# Patient Record
Sex: Male | Born: 1965 | Race: White | Hispanic: No | Smoking: Former smoker
Health system: Southern US, Community
[De-identification: ages and names within clinical notes are randomized; demographics above are authoritative.]

## PROBLEM LIST (undated history)

## (undated) DIAGNOSIS — F419 Anxiety disorder, unspecified: Secondary | ICD-10-CM

## (undated) DIAGNOSIS — F4310 Post-traumatic stress disorder, unspecified: Secondary | ICD-10-CM

## (undated) DIAGNOSIS — F32A Depression, unspecified: Secondary | ICD-10-CM

## (undated) DIAGNOSIS — G8929 Other chronic pain: Secondary | ICD-10-CM

## (undated) DIAGNOSIS — M549 Dorsalgia, unspecified: Secondary | ICD-10-CM

## (undated) DIAGNOSIS — C801 Malignant (primary) neoplasm, unspecified: Secondary | ICD-10-CM

## (undated) DIAGNOSIS — F329 Major depressive disorder, single episode, unspecified: Secondary | ICD-10-CM

## (undated) DIAGNOSIS — I10 Essential (primary) hypertension: Secondary | ICD-10-CM

## (undated) DIAGNOSIS — F1021 Alcohol dependence, in remission: Secondary | ICD-10-CM

## (undated) HISTORY — DX: Depression, unspecified: F32.A

## (undated) HISTORY — PX: VASECTOMY: SHX75

## (undated) HISTORY — DX: Alcohol dependence, in remission: F10.21

## (undated) HISTORY — DX: Dorsalgia, unspecified: M54.9

## (undated) HISTORY — DX: Post-traumatic stress disorder, unspecified: F43.10

## (undated) HISTORY — PX: OTHER SURGICAL HISTORY: SHX169

## (undated) HISTORY — DX: Anxiety disorder, unspecified: F41.9

## (undated) HISTORY — DX: Other chronic pain: G89.29

## (undated) HISTORY — DX: Essential (primary) hypertension: I10

---
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
## 2019-01-19 ENCOUNTER — Encounter: Payer: Self-pay | Admitting: Gastroenterology

## 2019-02-10 NOTE — Progress Notes (Addendum)
REVIEWED-NO ADDITIONAL RECOMMENDATIONS.  Referring Provider: Alfonse Flavors, MD Primary Care Physician:  Alfonse Flavors, MD Primary Gastroenterologist:  Dr. Oneida Alar  Chief Complaint  Patient presents with  . Colonoscopy    pt says he has loss alot of weight    HPI:   Jesus Graham is a 53 y.o. male presenting today at the request of Zhou-Talbert, Elwyn Lade, MD for consult colonoscopy. Office visit due to meds.   Today he states he has never had a colonoscopy. No abdominal pain. BMs daily. No constipation or diarrhea. No bright red blood per rectum. No black stools. No regular nausea or vomiting. Occasional nausea with anxiety. Admits to heartburn at times.  States this is not frequent.  Will use Rolaids as needed. Also with reflux symptoms at night if he is not elevated. If elevated, he has no trouble. Has changed his diet over the last year due to his cholesterol. With change in diet, his reflux symptoms have improved. Eliminated fried foods, doesn't eat as much meat. Doesn't want to be on any medications for reflux. No trouble swallowing.   Has lost about 22-23 lbs in the last few months. Has a lot of anxiety. Stressed. About to get a divorce after just getting married in February 2020. Has limited appetite with this. Not sleeping well.  He is seeing another provider about his anxiety and depression.  No fever, chills. Admits to some lightheadedness when bending over or standing up. No chest pain, heart palpitations. Occasional shortness of breath with exertion, thinks this is related to his knee pain. No cough. No nasal congestion or sore throat. Chronic left knee pain. Goody powders, one a day to 2-3 a day, or a few days without one.   Past Medical History:  Diagnosis Date  . Anxiety   . Back pain   . Chronic pain of left knee   . Depression   . History of alcoholism ([place])    quit in 1995  . HTN (hypertension)   . PTSD (post-traumatic stress disorder)     Past  Surgical History:  Procedure Laterality Date  . left knee surgery     x 2  . VASECTOMY      Current Outpatient Medications  Medication Sig Dispense Refill  . amLODipine (NORVASC) 10 MG tablet Take 10 mg by mouth daily.    Marland Kitchen oxyCODONE-acetaminophen (PERCOCET) 7.5-325 MG tablet Take 1 tablet by mouth 4 (four) times daily.    Marland Kitchen CLENPIQ 10-3.5-12 MG-GM -GM/160ML SOLN Take 1 kit by mouth once for 1 dose. 320 mL 0   No current facility-administered medications for this visit.     Allergies as of 02/11/2019 - Review Complete 02/11/2019  Allergen Reaction Noted  . Celexa [citalopram] Other (See Comments) 02/11/2019  . Codeine Nausea Only 02/11/2019  . Nsaids Nausea Only 02/11/2019    Family History  Problem Relation Age of Onset  . Colon cancer Neg Hx     Social History   Socioeconomic History  . Marital status: Divorced    Spouse name: Not on file  . Number of children: Not on file  . Years of education: Not on file  . Highest education level: Not on file  Occupational History  . Not on file  Social Needs  . Financial resource strain: Not on file  . Food insecurity    Worry: Not on file    Inability: Not on file  . Transportation needs    Medical: Not on file  Non-medical: Not on file  Tobacco Use  . Smoking status: Never Smoker  . Smokeless tobacco: Current User  . Tobacco comment: Dips occasionally  Substance and Sexual Activity  . Alcohol use: Not Currently  . Drug use: Never  . Sexual activity: Not on file  Lifestyle  . Physical activity    Days per week: Not on file    Minutes per session: Not on file  . Stress: Not on file  Relationships  . Social Herbalist on phone: Not on file    Gets together: Not on file    Attends religious service: Not on file    Active member of club or organization: Not on file    Attends meetings of clubs or organizations: Not on file    Relationship status: Not on file  . Intimate partner violence    Fear of  current or ex partner: Not on file    Emotionally abused: Not on file    Physically abused: Not on file    Forced sexual activity: Not on file  Other Topics Concern  . Not on file  Social History Narrative  . Not on file    Review of Systems: Gen: See HPI CV: See HPI  Resp: See HPI  GI: See HPI GU : Denies urinary burning, urinary frequency. Admits to urinary hesitancy MS: Chronic left knee pain and back pain with radiation down his left leg.   Derm: Denies rash Psych: Admits to anxiety and depression. No SI.  Heme: Denies bruising or bleeding.   Physical Exam: BP 139/80   Pulse 73   Temp (!) 96.9 F (36.1 C)   Ht 6' (1.829 m)   Wt 198 lb 9.6 oz (90.1 kg)   BMI 26.94 kg/m  General:   Alert and oriented. Pleasant and cooperative. Well-nourished and well-developed.  Head:  Normocephalic and atraumatic. Eyes:  Without icterus, sclera clear and conjunctiva pink.  Ears:  Normal auditory acuity. Lungs:  Clear to auscultation bilaterally. No wheezes, rales, or rhonchi. No distress.  Heart:  S1, S2 present without murmurs appreciated.  Abdomen:  +BS, soft, non-tender and non-distended. No HSM noted. No guarding or rebound. No masses appreciated.  Rectal:  Deferred  Msk:  Symmetrical without gross deformities. Normal posture. Extremities:  Without edema. Neurologic:  Alert and  oriented x4;  grossly normal neurologically. Skin:  Intact without significant lesions or rashes. Psych:  Normal mood and affect.

## 2019-02-11 ENCOUNTER — Encounter: Payer: Self-pay | Admitting: *Deleted

## 2019-02-11 ENCOUNTER — Other Ambulatory Visit: Payer: Self-pay

## 2019-02-11 ENCOUNTER — Ambulatory Visit: Payer: Medicaid Other | Admitting: Gastroenterology

## 2019-02-11 ENCOUNTER — Encounter: Payer: Self-pay | Admitting: Gastroenterology

## 2019-02-11 ENCOUNTER — Other Ambulatory Visit: Payer: Self-pay | Admitting: *Deleted

## 2019-02-11 VITALS — BP 139/80 | HR 73 | Temp 96.9°F | Ht 72.0 in | Wt 198.6 lb

## 2019-02-11 DIAGNOSIS — Z1211 Encounter for screening for malignant neoplasm of colon: Secondary | ICD-10-CM

## 2019-02-11 DIAGNOSIS — K219 Gastro-esophageal reflux disease without esophagitis: Secondary | ICD-10-CM | POA: Insufficient documentation

## 2019-02-11 MED ORDER — CLENPIQ 10-3.5-12 MG-GM -GM/160ML PO SOLN: 1.0000 | Freq: Once | ORAL | 0 refills | Status: AC

## 2019-02-11 NOTE — Progress Notes (Signed)
cc'ed to pcp °

## 2019-02-11 NOTE — Patient Instructions (Signed)
We will get you scheduled for your colonoscopy with Dr. Oneida Alar in the near future.   We will follow-up with you as recommended at the time of your colonoscopy.   Aliene Altes, PA-C [facility]

## 2019-02-11 NOTE — Assessment & Plan Note (Addendum)
53 year old male with past medical history of PTSD, anxiety, depression, HTN, chronic left knee pain on Percocet 4 times daily who presents to schedule his for ever screening colonoscopy.  No significant lower GI symptoms.  He has lost about 22-23 pounds in the last few months.  Reports he is under a lot of anxiety and is stressed as he is about to get a divorce after just getting married in February 2020.  Has decreased appetite and is not sleeping well due to this.  No other alarm symptoms.  Proceed with TCS with propofol with Dr. Oneida Alar in the near future. The risks, benefits, and alternatives have been discussed in detail with patient. They have stated understanding and desire to proceed.  Follow-up as recommended at the time of procedure.

## 2019-02-11 NOTE — Assessment & Plan Note (Signed)
Patient reports history of acid reflux and heartburn symptoms.  He has been working on changing his diet over the last year due to elevated cholesterol and has had improvement in GERD symptoms as well.  Also sleeps with the head of his bed elevated to prevent nocturnal reflux.  Continues with intermittent reflux symptoms at times, depending on what he eats.  Uses Rolaids as needed.  He is not interested in taking any medications regularly for this.  He does take Goody powders for chronic knee pain.  Discussed my concern of developing gastritis or PUD and the importance of eliminating Goody powders and NSAIDs or taking a PPI to protect the stomach.  Patient states he will decrease Goody powder use and does not want to start a PPI.

## 2019-02-12 ENCOUNTER — Encounter: Payer: Self-pay | Admitting: *Deleted

## 2019-05-25 ENCOUNTER — Other Ambulatory Visit (HOSPITAL_COMMUNITY)
Admission: RE | Admit: 2019-05-25 | Discharge: 2019-05-25 | Disposition: A | Discharge status: Home / Self Care | Payer: Medicaid Other | Source: Ambulatory Visit | Attending: Gastroenterology | Admitting: Gastroenterology

## 2019-05-25 ENCOUNTER — Other Ambulatory Visit: Payer: Self-pay

## 2019-05-25 ENCOUNTER — Encounter (HOSPITAL_COMMUNITY)
Admission: RE | Admit: 2019-05-25 | Discharge: 2019-05-25 | Disposition: A | Discharge status: Home / Self Care | Payer: Medicaid Other | Source: Ambulatory Visit | Attending: Gastroenterology | Admitting: Gastroenterology

## 2019-05-25 DIAGNOSIS — Z20822 Contact with and (suspected) exposure to covid-19: Secondary | ICD-10-CM | POA: Insufficient documentation

## 2019-05-25 DIAGNOSIS — Z01812 Encounter for preprocedural laboratory examination: Secondary | ICD-10-CM | POA: Diagnosis not present

## 2019-05-25 LAB — SARS CORONAVIRUS 2 (TAT 6-24 HRS): SARS Coronavirus 2: NEGATIVE

## 2019-05-26 ENCOUNTER — Ambulatory Visit (HOSPITAL_COMMUNITY): Payer: Medicaid Other | Admitting: Anesthesiology

## 2019-05-26 ENCOUNTER — Other Ambulatory Visit: Payer: Self-pay

## 2019-05-26 ENCOUNTER — Telehealth: Payer: Self-pay

## 2019-05-26 ENCOUNTER — Encounter (HOSPITAL_COMMUNITY): Payer: Self-pay | Admitting: Gastroenterology

## 2019-05-26 ENCOUNTER — Ambulatory Visit (HOSPITAL_COMMUNITY)
Admission: RE | Admit: 2019-05-26 | Discharge: 2019-05-26 | Disposition: A | Discharge status: Home / Self Care | Payer: Medicaid Other | Attending: Gastroenterology | Admitting: Gastroenterology

## 2019-05-26 ENCOUNTER — Encounter (HOSPITAL_COMMUNITY)
Admission: RE | Disposition: A | Discharge status: Home / Self Care | Payer: Self-pay | Source: Home / Self Care | Attending: Gastroenterology

## 2019-05-26 DIAGNOSIS — Q438 Other specified congenital malformations of intestine: Secondary | ICD-10-CM | POA: Diagnosis not present

## 2019-05-26 DIAGNOSIS — Z79899 Other long term (current) drug therapy: Secondary | ICD-10-CM | POA: Diagnosis not present

## 2019-05-26 DIAGNOSIS — Z791 Long term (current) use of non-steroidal anti-inflammatories (NSAID): Secondary | ICD-10-CM | POA: Insufficient documentation

## 2019-05-26 DIAGNOSIS — M549 Dorsalgia, unspecified: Secondary | ICD-10-CM | POA: Diagnosis not present

## 2019-05-26 DIAGNOSIS — Z1211 Encounter for screening for malignant neoplasm of colon: Secondary | ICD-10-CM

## 2019-05-26 DIAGNOSIS — Z885 Allergy status to narcotic agent status: Secondary | ICD-10-CM | POA: Insufficient documentation

## 2019-05-26 DIAGNOSIS — F4310 Post-traumatic stress disorder, unspecified: Secondary | ICD-10-CM | POA: Diagnosis not present

## 2019-05-26 DIAGNOSIS — K621 Rectal polyp: Secondary | ICD-10-CM

## 2019-05-26 DIAGNOSIS — M25562 Pain in left knee: Secondary | ICD-10-CM | POA: Insufficient documentation

## 2019-05-26 DIAGNOSIS — K648 Other hemorrhoids: Secondary | ICD-10-CM | POA: Insufficient documentation

## 2019-05-26 DIAGNOSIS — G8929 Other chronic pain: Secondary | ICD-10-CM | POA: Insufficient documentation

## 2019-05-26 DIAGNOSIS — D123 Benign neoplasm of transverse colon: Secondary | ICD-10-CM | POA: Diagnosis not present

## 2019-05-26 DIAGNOSIS — Z888 Allergy status to other drugs, medicaments and biological substances status: Secondary | ICD-10-CM | POA: Insufficient documentation

## 2019-05-26 DIAGNOSIS — F419 Anxiety disorder, unspecified: Secondary | ICD-10-CM | POA: Insufficient documentation

## 2019-05-26 DIAGNOSIS — I10 Essential (primary) hypertension: Secondary | ICD-10-CM | POA: Insufficient documentation

## 2019-05-26 DIAGNOSIS — K219 Gastro-esophageal reflux disease without esophagitis: Secondary | ICD-10-CM | POA: Insufficient documentation

## 2019-05-26 DIAGNOSIS — K644 Residual hemorrhoidal skin tags: Secondary | ICD-10-CM | POA: Diagnosis not present

## 2019-05-26 DIAGNOSIS — F329 Major depressive disorder, single episode, unspecified: Secondary | ICD-10-CM | POA: Insufficient documentation

## 2019-05-26 DIAGNOSIS — K635 Polyp of colon: Secondary | ICD-10-CM | POA: Diagnosis not present

## 2019-05-26 DIAGNOSIS — F17290 Nicotine dependence, other tobacco product, uncomplicated: Secondary | ICD-10-CM | POA: Diagnosis not present

## 2019-05-26 HISTORY — PX: POLYPECTOMY: SHX5525

## 2019-05-26 HISTORY — PX: COLONOSCOPY WITH PROPOFOL: SHX5780

## 2019-05-26 SURGERY — COLONOSCOPY WITH PROPOFOL
Anesthesia: General

## 2019-05-26 MED ORDER — STERILE WATER FOR IRRIGATION IR SOLN
Status: DC | PRN
  Administered 2019-05-26: 100 mL

## 2019-05-26 MED ORDER — PROPOFOL 10 MG/ML IV BOLUS
INTRAVENOUS | Status: AC
  Filled 2019-05-26: qty 20

## 2019-05-26 MED ORDER — FENTANYL CITRATE (PF) 100 MCG/2ML IJ SOLN
INTRAMUSCULAR | Status: DC | PRN
  Administered 2019-05-26 (×2): 50 ug via INTRAVENOUS

## 2019-05-26 MED ORDER — FENTANYL CITRATE (PF) 100 MCG/2ML IJ SOLN
INTRAMUSCULAR | Status: AC
  Filled 2019-05-26: qty 2

## 2019-05-26 MED ORDER — LIDOCAINE HCL (CARDIAC) PF 100 MG/5ML IV SOSY
PREFILLED_SYRINGE | INTRAVENOUS | Status: DC | PRN
  Administered 2019-05-26: 50 mg via INTRATRACHEAL

## 2019-05-26 MED ORDER — LIDOCAINE 2% (20 MG/ML) 5 ML SYRINGE
INTRAMUSCULAR | Status: AC
  Filled 2019-05-26: qty 5

## 2019-05-26 MED ORDER — LACTATED RINGERS IV SOLN: INTRAVENOUS | Status: DC | PRN

## 2019-05-26 MED ORDER — PROPOFOL 10 MG/ML IV BOLUS
INTRAVENOUS | Status: DC | PRN
  Administered 2019-05-26: 100 mg via INTRAVENOUS

## 2019-05-26 MED ORDER — PROPOFOL 10 MG/ML IV BOLUS
INTRAVENOUS | Status: AC
  Filled 2019-05-26: qty 40

## 2019-05-26 MED ORDER — PROPOFOL 500 MG/50ML IV EMUL
INTRAVENOUS | Status: DC | PRN
  Administered 2019-05-26: 300 ug/kg/min via INTRAVENOUS

## 2019-05-26 NOTE — Transfer of Care (Signed)
Immediate Anesthesia Transfer of Care Note  Patient: Jesus Graham  Procedure(s) Performed: COLONOSCOPY WITH PROPOFOL (N/A ) POLYPECTOMY  Patient Location: PACU  Anesthesia Type:General  Level of Consciousness: awake, alert  and oriented  Airway & Oxygen Therapy: Patient Spontanous Breathing  Post-op Assessment: Report given to RN, Post -op Vital signs reviewed and stable and Patient moving all extremities X 4  Post vital signs: Reviewed and stable  Last Vitals:  Vitals Value Taken Time  BP 115/81 05/26/19 1109  Temp    Pulse 78 05/26/19 1111  Resp 19 05/26/19 1111  SpO2 99 % 05/26/19 1111  Vitals shown include unvalidated device data.  Last Pain:  Vitals:   05/26/19 1104  TempSrc:   PainSc: 0-No pain      Patients Stated Pain Goal: 6 ([id number])  Complications: No apparent anesthesia complications

## 2019-05-26 NOTE — Telephone Encounter (Signed)
Pt had called office after hours and left 2 voicemails,  difficult to understand pt d/t phone cutting in and out.   Called pt, he didn't receive call from pre-op nurse yesterday and he didn't know what time to arrive for TCS this morning. Pt said the hospital had already called him this morning and he is on way to hospital. Called and informed Threasa Beards pt is on the way.

## 2019-05-26 NOTE — Anesthesia Preprocedure Evaluation (Signed)
Anesthesia Evaluation  Patient identified by MRN, date of birth, ID band Patient awake    Reviewed: Allergy & Precautions, NPO status , Patient's Chart, lab work & pertinent test results  History of Anesthesia Complications Negative for: history of anesthetic complications  Airway Mallampati: II  TM Distance: >3 FB Neck ROM: Full    Dental  (+) Missing, Dental Advisory Given   Pulmonary Patient abstained from smoking.,    Pulmonary exam normal breath sounds clear to auscultation       Cardiovascular Exercise Tolerance: Good hypertension, Pt. on medications  Rhythm:Regular Rate:Normal     Neuro/Psych PSYCHIATRIC DISORDERS Anxiety Depression    GI/Hepatic Neg liver ROS, GERD  Controlled,  Endo/Other  negative endocrine ROS  Renal/GU negative Renal ROS  negative genitourinary   Musculoskeletal negative musculoskeletal ROS (+)   Abdominal   Peds negative pediatric ROS (+)  Hematology negative hematology ROS (+)   Anesthesia Other Findings   Reproductive/Obstetrics negative OB ROS                            Anesthesia Physical Anesthesia Plan  ASA: II  Anesthesia Plan: General   Post-op Pain Management:    Induction: Intravenous  PONV Risk Score and Plan: 1 and Treatment may vary due to age or medical condition and TIVA  Airway Management Planned: Nasal Cannula, Natural Airway and Simple Face Mask  Additional Equipment:   Intra-op Plan:   Post-operative Plan:   Informed Consent: I have reviewed the patients History and Physical, chart, labs and discussed the procedure including the risks, benefits and alternatives for the proposed anesthesia with the patient or authorized representative who has indicated his/her understanding and acceptance.     Dental advisory given  Plan Discussed with: CRNA  Anesthesia Plan Comments:         Anesthesia Quick Evaluation

## 2019-05-26 NOTE — Discharge Instructions (Signed)
You had 2 polyps removed. You have SMALL internal hemorrhoids.   DRINK WATER TO KEEP YOUR URINE LIGHT YELLOW.  FOLLOW A HIGH FIBER DIET. AVOID ITEMS THAT CAUSE BLOATING & GAS. SEE INFO BELOW.   USE PREPARATION H FOUR TIMES  A DAY IF NEEDED TO RELIEVE RECTAL PAIN/PRESSURE/BLEEDING.   YOUR BIOPSY RESULTS WILL BE BACK IN 5 BUSINESS DAYS.  YOUR next colonoscopy WILL BE SCHEDULED BASED ON YOUR FINAL PATHOLOGY REPORT.    Colonoscopy Care After Read the instructions outlined below and refer to this sheet in the next week. These discharge instructions provide you with general information on caring for yourself after you leave the hospital. While your treatment has been planned according to the most current medical practices available, unavoidable complications occasionally occur. If you have any problems or questions after discharge, call DR. Sharlyne Koeneman, [phone].  ACTIVITY  You may resume your regular activity, but move at a slower pace for the next 24 hours.   Take frequent rest periods for the next 24 hours.   Walking will help get rid of the air and reduce the bloated feeling in your belly (abdomen).   No driving for 24 hours (because of the medicine (anesthesia) used during the test).   You may shower.   Do not sign any important legal documents or operate any machinery for 24 hours (because of the anesthesia used during the test).    NUTRITION  Drink plenty of fluids.   You may resume your normal diet as instructed by your doctor.   Begin with a light meal and progress to your normal diet. Heavy or fried foods are harder to digest and may make you feel sick to your stomach (nauseated).   Avoid alcoholic beverages for 24 hours or as instructed.    MEDICATIONS  You may resume your normal medications.   WHAT YOU CAN EXPECT TODAY  Some feelings of bloating in the abdomen.   Passage of more gas than usual.   Spotting of blood in your stool or on the toilet paper  .   IF YOU HAD POLYPS REMOVED DURING THE COLONOSCOPY:  Eat a soft diet IF YOU HAVE NAUSEA, BLOATING, ABDOMINAL PAIN, OR VOMITING.    FINDING OUT THE RESULTS OF YOUR TEST Not all test results are available during your visit. DR. Oneida Alar WILL CALL YOU WITHIN 14 DAYS OF YOUR PROCEDUE WITH YOUR RESULTS. Do not assume everything is normal if you have not heard from DR. Drayk Humbarger, CALL HER OFFICE AT [phone].  SEEK IMMEDIATE MEDICAL ATTENTION AND CALL THE OFFICE: [phone] IF:  You have more than a spotting of blood in your stool.   Your belly is swollen (abdominal distention).   You are nauseated or vomiting.   You have a temperature over 101F.   You have abdominal pain or discomfort that is severe or gets worse throughout the day.   High-Fiber Diet A high-fiber diet changes your normal diet to include more whole grains, legumes, fruits, and vegetables. Changes in the diet involve replacing refined carbohydrates with unrefined foods. The calorie level of the diet is essentially unchanged. The Dietary Reference Intake (recommended amount) for adult males is 38 grams per day. For adult females, it is 25 grams per day. Pregnant and lactating women should consume 28 grams of fiber per day. Fiber is the intact part of a plant that is not broken down during digestion. Functional fiber is fiber that has been isolated from the plant to provide a beneficial effect in the  body.  PURPOSE  Increase stool bulk.   Ease and regulate bowel movements.   Lower cholesterol.   REDUCE RISK OF COLON CANCER  INDICATIONS THAT YOU NEED MORE FIBER  Constipation and hemorrhoids.   Uncomplicated diverticulosis (intestine condition) and irritable bowel syndrome.   Weight management.   As a protective measure against hardening of the arteries (atherosclerosis), diabetes, and cancer.   GUIDELINES FOR INCREASING FIBER IN THE DIET  Start adding fiber to the diet slowly. A gradual increase of about 5 more  grams (2 servings of most fruits or vegetables) per day is best. Too rapid an increase in fiber may result in constipation, flatulence, and bloating.   Drink enough water and fluids to keep your urine clear or pale yellow. Water, juice, or caffeine-free drinks are recommended. Not drinking enough fluid may cause constipation.   Eat a variety of high-fiber foods rather than one type of fiber.   Try to increase your intake of fiber through using high-fiber foods rather than fiber pills or supplements that contain small amounts of fiber.   The goal is to change the types of food eaten. Do not supplement your present diet with high-fiber foods, but replace foods in your present diet.    Polyps, Colon  A polyp is extra tissue that grows inside your body. Colon polyps grow in the large intestine. The large intestine, also called the colon, is part of your digestive system. It is a long, hollow tube at the end of your digestive tract where your body makes and stores stool. Most polyps are not dangerous. They are benign. This means they are not cancerous. But over time, some types of polyps can turn into cancer. Polyps that are smaller than a pea are usually not harmful. But larger polyps could someday become or may already be cancerous. To be safe, doctors remove all polyps and test them.   WHO GETS POLYPS? Anyone can get polyps, but certain people are more likely than others. You may have a greater chance of getting polyps if:  You are over 50.   You have had polyps before.   Someone in your family has had polyps.   Someone in your family has had cancer of the large intestine.   Find out if someone in your family has had polyps. You may also be more likely to get polyps if you:   Eat a lot of fatty foods   Smoke   Drink alcohol   Do not exercise  Eat too much   PREVENTION There is not one sure way to prevent polyps. You might be able to lower your risk of getting them if you:  Eat more  fruits and vegetables and less fatty food.   Do not smoke.   Avoid alcohol.   Exercise every day.   Lose weight if you are overweight.   Eating more calcium and folate can also lower your risk of getting polyps. Some foods that are rich in calcium are milk, cheese, and broccoli. Some foods that are rich in folate are chickpeas, kidney beans, and spinach.

## 2019-05-26 NOTE — H&P (Addendum)
Primary Care Physician:  Zhou-Talbert, Elwyn Lade, MD Primary Gastroenterologist:  Dr. Oneida Alar  Pre-Procedure History & Physical: HPI:  Jesus Graham is a 54 y.o. male here for SCREENING FOR COLON CANCER AND BARRETT'S ESOPHAGUS.  Past Medical History:  Diagnosis Date  . Anxiety   . Back pain   . Chronic pain of left knee   . Depression   . History of alcoholism ([place])    quit in 1995  . HTN (hypertension)   . PTSD (post-traumatic stress disorder)     Past Surgical History:  Procedure Laterality Date  . left knee surgery     x 2  . VASECTOMY      Prior to Admission medications   Medication Sig Start Date End Date Taking? Authorizing Provider  amLODipine (NORVASC) 10 MG tablet Take 10 mg by mouth daily.   Yes [provider]  diazepam (VALIUM) 2 MG tablet Take 4 mg by mouth daily in the afternoon.   Yes [provider]  diazepam (VALIUM) 5 MG tablet Take 10 mg by mouth in the morning and at bedtime.   Yes [provider]  DULoxetine (CYMBALTA) 60 MG capsule Take 60 mg by mouth daily.   Yes [provider]  gabapentin (NEURONTIN) 300 MG capsule Take 900 mg by mouth 3 (three) times daily.   Yes [provider]  HYDROcodone-acetaminophen (NORCO) 10-325 MG tablet Take 1 tablet by mouth every 6 (six) hours as needed (pain.).   Yes [provider]  Tolnaftate (ABSORBINE JR EX) Apply 1 application topically 4 (four) times daily as needed (pain.).   Yes [provider]    Allergies as of 02/11/2019 - Review Complete 02/11/2019  Allergen Reaction Noted  . Celexa [citalopram] Other (See Comments) 02/11/2019  . Codeine Nausea Only 02/11/2019  . Nsaids Nausea Only 02/11/2019    Family History  Problem Relation Age of Onset  . Colon cancer Neg Hx     Social History   Socioeconomic History  . Marital status: Legally Separated    Spouse name: Not on file  . Number of children: Not on file  . Years of education: Not on  file  . Highest education level: Not on file  Occupational History  . Not on file  Tobacco Use  . Smoking status: Never Smoker  . Smokeless tobacco: Current User  . Tobacco comment: Dips occasionally  Substance and Sexual Activity  . Alcohol use: Not Currently  . Drug use: Never  . Sexual activity: Not on file  Other Topics Concern  . Not on file  Social History Narrative  . Not on file   Social Determinants of Health   Financial Resource Strain:   . Difficulty of Paying Living Expenses:   Food Insecurity:   . Worried About Charity fundraiser in the Last Year:   . Arboriculturist in the Last Year:   Transportation Needs:   . Film/video editor (Medical):   Marland Kitchen Lack of Transportation (Non-Medical):   Physical Activity:   . Days of Exercise per Week:   . Minutes of Exercise per Session:   Stress:   . Feeling of Stress :   Social Connections:   . Frequency of Communication with Friends and Family:   . Frequency of Social Gatherings with Friends and Family:   . Attends Religious Services:   . Active Member of Clubs or Organizations:   . Attends Archivist Meetings:   Marland Kitchen Marital Status:  Intimate Partner Violence:   . Fear of Current or Ex-Partner:   . Emotionally Abused:   Marland Kitchen Physically Abused:   . Sexually Abused:     Review of Systems: See HPI, otherwise negative ROS   Physical Exam: BP (!) 146/97   Pulse 82   Temp 98.1 F (36.7 C) (Oral)   Resp 16   SpO2 97%  General:   Alert,  pleasant and cooperative in NAD Head:  Normocephalic and atraumatic. Neck:  Supple; Lungs:  Clear throughout to auscultation.    Heart:  Regular rate and rhythm. Abdomen:  Soft, nontender and nondistended. Normal bowel sounds, without guarding, and without rebound.   Neurologic:  Alert and  oriented x4;  grossly normal neurologically.  Impression/Plan:    SCREENING FOR COLON CANCER  Plan:  1. TCS TODAY. DISCUSSED PROCEDURE, BENEFITS, & RISKS: < 1% chance of  medication reaction, bleeding, perforation, ASPIRATION, or rupture of spleen/liver requiring surgery to fix it and missed polyps < 1 cm 10-20% of the time. 2. Pt declined EGD TODAY. HE WAS CONCERNED ABOUT COST.

## 2019-05-26 NOTE — Op Note (Signed)
[facility] Patient Name: Jesus Graham Procedure Date: 05/26/2019 10:06 AM MRN: [id number] Date of Birth: 03-Nov-1965 Attending MD: Barney Drain MD, MD CSN: [id number] Age: 54 Admit Type: Outpatient Procedure:                Colonoscopy WITH COLD SNARE POLYPECTOMY Indications:              Screening for colorectal malignant neoplasm Providers:                Barney Drain MD, MD, Rosina Lowenstein, RN, Randa Spike, Technician Referring MD:             Curtis Sites Medicines:                Propofol per Anesthesia Complications:            No immediate complications. Estimated Blood Loss:     Estimated blood loss was minimal. Procedure:                Pre-Anesthesia Assessment:                           - Prior to the procedure, a History and Physical                            was performed, and patient medications and                            allergies were reviewed. The patient's tolerance of                            previous anesthesia was also reviewed. The risks                            and benefits of the procedure and the sedation                            options and risks were discussed with the patient.                            All questions were answered, and informed consent                            was obtained. Prior Anticoagulants: The patient has                            taken no previous anticoagulant or antiplatelet                            agents. ASA Grade Assessment: II - A patient with                            mild systemic disease. After reviewing the risks  and benefits, the patient was deemed in                            satisfactory condition to undergo the procedure.                            After obtaining informed consent, the colonoscope                            was passed under direct vision. Throughout the                            procedure, the patient's blood pressure,  pulse, and                            oxygen saturations were monitored continuously. The                            CF-[id number] scope was introduced through                            the anus and advanced to the the cecum, identified                            by appendiceal orifice and ileocecal valve. The                            colonoscopy was somewhat difficult due to a                            tortuous colon. Successful completion of the                            procedure was aided by straightening and shortening                            the scope to obtain bowel loop reduction and                            COLOWRAP. The patient tolerated the procedure well.                            The quality of the bowel preparation was excellent.                            The ileocecal valve, appendiceal orifice, and                            rectum were photographed. Scope In: 10:45:42 AM Scope Out: 11:03:16 AM Scope Withdrawal Time: 0 hours 14 minutes 47 seconds  Total Procedure Duration: 0 hours 17 minutes 34 seconds  Findings:      Two sessile polyps were found in the rectum and hepatic flexure. The       polyps were  4 to 6 mm in size. These polyps were removed with a cold       snare. Resection and retrieval were complete.      External and internal hemorrhoids were found.      The recto-sigmoid colon and sigmoid colon were mildly tortuous. Impression:               - Two 4 to 6 mm polyps in the rectum and at the                            hepatic flexure, removed with a cold snare.                            Resected and retrieved.                           - External and internal hemorrhoids.                           - Tortuous colon. Moderate Sedation:      Per Anesthesia Care Recommendation:           - Patient has a contact number available for                            emergencies. The signs and symptoms of potential                            delayed  complications were discussed with the                            patient. Return to normal activities tomorrow.                            Written discharge instructions were provided to the                            patient.                           - High fiber diet.                           - Continue present medications.                           - Await pathology results.                           - Repeat colonoscopy date to be determined after                            pending pathology results are reviewed for                            surveillance. Procedure Code(s):        --- Professional ---  45385, Colonoscopy, flexible; with removal of                            tumor(s), polyp(s), or other lesion(s) by snare                            technique Diagnosis Code(s):        --- Professional ---                           Z12.11, Encounter for screening for malignant                            neoplasm of colon                           K62.1, Rectal polyp                           K63.5, Polyp of colon                           K64.8, Other hemorrhoids                           Q43.8, Other specified congenital malformations of                            intestine CPT copyright 2019 American Medical Association. All rights reserved. The codes documented in this report are preliminary and upon coder review may  be revised to meet current compliance requirements. Barney Drain, MD Barney Drain MD, MD 05/26/2019 11:13:36 AM This report has been signed electronically. Number of Addenda: 0

## 2019-05-26 NOTE — Anesthesia Postprocedure Evaluation (Signed)
Anesthesia Post Note  Patient: Jesus Graham  Procedure(s) Performed: COLONOSCOPY WITH PROPOFOL (N/A ) POLYPECTOMY  Patient location during evaluation: Endoscopy Anesthesia Type: General Level of consciousness: awake and alert Pain management: pain level controlled Vital Signs Assessment: post-procedure vital signs reviewed and stable Respiratory status: spontaneous breathing, nonlabored ventilation, respiratory function stable and patient connected to nasal cannula oxygen Cardiovascular status: blood pressure returned to baseline and stable Postop Assessment: no apparent nausea or vomiting Anesthetic complications: no     Last Vitals:  Vitals:   05/26/19 1110 05/26/19 1115  BP: 115/81 128/87  Pulse: 79 73  Resp: 19 (!) 9  Temp: 36.4 C   SpO2:  99%    Last Pain:  Vitals:   05/26/19 1115  TempSrc:   PainSc: 0-No pain                 Adair Patter Rahil Passey

## 2019-05-27 LAB — SURGICAL PATHOLOGY

## 2019-05-28 ENCOUNTER — Telehealth: Payer: Self-pay | Admitting: Gastroenterology

## 2019-05-28 NOTE — Telephone Encounter (Signed)
PLEASE CALL PT. HE HAD ONE SIMPLE ADENOMA AND ONE HYPERPLASTIC POLYP REMOVED. NEXT COLONOSCOPY AS SOON AS MAR 2026 AND NO LATER THAN MAR 2028.

## 2019-05-28 NOTE — Telephone Encounter (Signed)
Spoke with pt and he is aware of results. 

## 2019-05-28 NOTE — Telephone Encounter (Signed)
Reminder in epic °

## 2019-05-28 NOTE — Telephone Encounter (Signed)
Patient returned call, please call back  

## 2019-05-28 NOTE — Telephone Encounter (Signed)
Called pt and notified him HE HAD ONE SIMPLE ADENOMA AND ONE HYPERPLASTIC POLYP REMOVED. NEXT COLONOSCOPY AS SOON AS MAR 2026 AND NO LATER THAN MAR 2028. He thanked me for the call and stated he understood
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
## 2019-07-18 ENCOUNTER — Encounter (HOSPITAL_COMMUNITY): Payer: Self-pay | Admitting: Psychiatry

## 2019-07-18 ENCOUNTER — Other Ambulatory Visit: Payer: Self-pay

## 2019-07-18 ENCOUNTER — Telehealth (INDEPENDENT_AMBULATORY_CARE_PROVIDER_SITE_OTHER): Payer: Medicaid Other | Admitting: Psychiatry

## 2019-07-18 DIAGNOSIS — F331 Major depressive disorder, recurrent, moderate: Secondary | ICD-10-CM | POA: Diagnosis not present

## 2019-07-18 DIAGNOSIS — F4310 Post-traumatic stress disorder, unspecified: Secondary | ICD-10-CM | POA: Diagnosis not present

## 2019-07-18 MED ORDER — RISPERIDONE 0.5 MG PO TABS: 0.5000 mg | ORAL_TABLET | Freq: Every day | ORAL | 1 refills | Status: DC

## 2019-07-18 NOTE — Patient Instructions (Signed)
1. Continue duloxetine 60 mg daily  2. Start risperidone 0.5 mg at night  3. Next appointment: 6/18 at 10:50
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
## 2019-08-28 ENCOUNTER — Other Ambulatory Visit: Payer: Self-pay

## 2019-08-28 ENCOUNTER — Telehealth (HOSPITAL_COMMUNITY): Payer: Self-pay | Admitting: Psychiatry

## 2019-08-28 ENCOUNTER — Telehealth (HOSPITAL_COMMUNITY): Payer: Medicaid Other | Admitting: Psychiatry

## 2019-08-28 NOTE — Telephone Encounter (Signed)
Patient called to inquire if he would be seeing the same doctor, I advised pt he would be seeing Dr. Modesta Messing. Patient became upset and stated "I don't want to see her I want an [place]", advising he will not be seeing her, stating "I can't even understand her talk and I don't talk her talk  I'm an [place] and I want to see an [place]". Patient then advised he will have his doctor give him another referral for another place and then hung the phone up.

## 2019-09-05 ENCOUNTER — Emergency Department (HOSPITAL_COMMUNITY): Payer: Medicaid Other

## 2019-09-05 ENCOUNTER — Emergency Department (HOSPITAL_COMMUNITY)
Admission: EM | Admit: 2019-09-05 | Discharge: 2019-09-05 | Disposition: A | Discharge status: Home / Self Care | Payer: Medicaid Other | Attending: Emergency Medicine | Admitting: Emergency Medicine

## 2019-09-05 ENCOUNTER — Encounter (HOSPITAL_COMMUNITY): Payer: Self-pay | Admitting: Emergency Medicine

## 2019-09-05 ENCOUNTER — Other Ambulatory Visit: Payer: Self-pay

## 2019-09-05 DIAGNOSIS — I10 Essential (primary) hypertension: Secondary | ICD-10-CM | POA: Insufficient documentation

## 2019-09-05 DIAGNOSIS — M7989 Other specified soft tissue disorders: Secondary | ICD-10-CM | POA: Diagnosis not present

## 2019-09-05 DIAGNOSIS — Z87891 Personal history of nicotine dependence: Secondary | ICD-10-CM | POA: Diagnosis not present

## 2019-09-05 DIAGNOSIS — M79662 Pain in left lower leg: Secondary | ICD-10-CM | POA: Insufficient documentation

## 2019-09-05 DIAGNOSIS — Z79899 Other long term (current) drug therapy: Secondary | ICD-10-CM | POA: Diagnosis not present

## 2019-09-05 DIAGNOSIS — M79661 Pain in right lower leg: Secondary | ICD-10-CM | POA: Diagnosis not present

## 2019-09-05 LAB — BASIC METABOLIC PANEL
Anion gap: 8 (ref 5–15)
BUN: 12 mg/dL (ref 6–20)
CO2: 25 mmol/L (ref 22–32)
Calcium: 8.9 mg/dL (ref 8.9–10.3)
Chloride: 103 mmol/L (ref 98–111)
Creatinine, Ser: 0.9 mg/dL (ref 0.61–1.24)
GFR calc Af Amer: >60 mL/min (ref >60)
GFR calc non Af Amer: >60 mL/min (ref >60)
Glucose, Bld: 101 mg/dL — ABNORMAL HIGH (ref 70–99)
Potassium: 3.7 mmol/L (ref 3.5–5.1)
Sodium: 136 mmol/L (ref 135–145)

## 2019-09-05 LAB — CBC
HCT: 40.5 % (ref 39.0–52.0)
Hemoglobin: 13.8 g/dL (ref 13.0–17.0)
MCH: 30.3 pg (ref 26.0–34.0)
MCHC: 34.1 g/dL (ref 30.0–36.0)
MCV: 89 fL (ref 80.0–100.0)
Platelets: 287 10*3/uL (ref 150–400)
RBC: 4.55 MIL/uL (ref 4.22–5.81)
RDW: 13.3 % (ref 11.5–15.5)
WBC: 9.2 10*3/uL (ref 4.0–10.5)
nRBC: 0 % (ref 0.0–0.2)

## 2019-09-05 LAB — BRAIN NATRIURETIC PEPTIDE: B Natriuretic Peptide: 17 pg/mL (ref 0.0–100.0)

## 2019-09-05 MED ORDER — MORPHINE SULFATE (PF) 10 MG/ML IV SOLN
10.0000 mg | Freq: Once | INTRAVENOUS | Status: AC
  Administered 2019-09-05: 10 mg via INTRAMUSCULAR
  Filled 2019-09-05: qty 1

## 2019-09-05 NOTE — Discharge Instructions (Signed)
Your work-up today was overall reassuring.  Your ultrasound study did not show that you had a clot.  You also do not have a new onset heart failure.  Please use compression socks, elevate your legs, decrease salt intake to help decrease the swelling in your legs.  Provide a vascular doctor referral.  Please follow-up with your primary care doctor and pain doctor for further management of your sciatica symptoms

## 2019-09-05 NOTE — ED Notes (Signed)
Patient transported to Ultrasound 

## 2019-09-05 NOTE — ED Notes (Signed)
ED Provider at bedside. 

## 2019-09-05 NOTE — ED Notes (Signed)
Lab in room for bloodwork

## 2019-09-05 NOTE — ED Provider Notes (Signed)
[facility] Provider Note   CSN: [id number] Arrival date & time: 09/05/19  1107     History No chief complaint on file.   Jesus Graham is a 54 y.o. male.  HPI 54 year old male with a history of chronic back pain with sciatica, pain in left knee, depression, alcoholism, hypertension, PTSD and anxiety presents to the ER with a 1 week history of swelling to his lower extremities and pain starting in the left buttock radiating down into his leg.  Patient has been seen by a spine specialist and has received 2 lumbar ESI's with little long-term relief.  He states that he takes 900 mg of gabapentin 3 times a day and is also taking Percocet with little relief.  His main concern today is bilateral lower extremity swelling which started about a week ago.  He notes that he can stick his finger to his leg and it stays.  His legs have been mild to moderately painful, no noticeable erythema, has not noticed any numbness or tingling.  He has not noticed his extremities going cold.  This is never happened to him before.  He has no history of blood clots, no recent travel, not on blood thinners.  No history of heart failure.  He denies any groin numbness, foot drop, bowel or bladder incontinence.  Denies any shortness of breath, though he states that when he feels the shooting pain down his leg it catches his breath.  No fevers or chills, chest pain,abdominal pain, headache, dizziness.  Patient refers that he was put on a steroid pack many months ago by his PCP which helped his symptoms.   his pain is primarily managed by his PCP per the patient.    Past Medical History:  Diagnosis Date  . Anxiety   . Back pain   . Chronic pain of left knee   . Depression   . History of alcoholism ([place])    quit in 1995  . HTN (hypertension)   . PTSD (post-traumatic stress disorder)     Patient Active Problem List   Diagnosis Date Noted  . Colorectal polyps   . GERD (gastroesophageal reflux  disease) 02/11/2019  . Colon cancer screening 02/11/2019    Past Surgical History:  Procedure Laterality Date  . COLONOSCOPY WITH PROPOFOL N/A 05/26/2019   Procedure: COLONOSCOPY WITH PROPOFOL;  Surgeon: Danie Binder, MD;  [facility];  Service: Endoscopy;  Laterality: N/A;  8:45am  . left knee surgery     x 2  . POLYPECTOMY  05/26/2019   Procedure: POLYPECTOMY;  Surgeon: Danie Binder, MD;  [facility];  Service: Endoscopy;;  . VASECTOMY         Family History  Problem Relation Age of Onset  . Alcohol abuse Father   . Colon cancer Neg Hx     Social History   Tobacco Use  . Smoking status: Former Smoker    Types: Cigarettes    Quit date: 03/13/1999    Years since quitting: 20.4  . Smokeless tobacco: Current User  . Tobacco comment: Dips occasionally  Vaping Use  . Vaping Use: Never used  Substance Use Topics  . Alcohol use: Not Currently  . Drug use: Never    Home Medications Prior to Admission medications   Medication Sig Start Date End Date Taking? Authorizing Provider  amLODipine (NORVASC) 10 MG tablet Take 10 mg by mouth daily.    [provider]  diazepam (VALIUM) 2 MG tablet  Take 4 mg by mouth daily in the afternoon.    [provider]  diazepam (VALIUM) 5 MG tablet Take 10 mg by mouth in the morning and at bedtime.    [provider]  DULoxetine (CYMBALTA) 60 MG capsule Take 60 mg by mouth daily.    [provider]  gabapentin (NEURONTIN) 300 MG capsule Take 300 mg by mouth 3 (three) times daily.     [provider]  HYDROcodone-acetaminophen (NORCO) 10-325 MG tablet Take 1 tablet by mouth every 6 (six) hours as needed (pain.).    [provider]  risperiDONE (RISPERDAL) 0.5 MG tablet Take 1 tablet (0.5 mg total) by mouth at bedtime. 07/18/19   Norman Clay, MD  Tolnaftate (ABSORBINE JR EX) Apply 1 application topically 4 (four) times daily as needed (pain.).    [provider]    Allergies    Celexa [citalopram], Codeine, and Nsaids  Review of Systems   Review of Systems  Constitutional: Negative for chills and fever.  HENT: Negative for ear pain and sore throat.   Eyes: Negative for pain and visual disturbance.  Respiratory: Negative for cough and shortness of breath.   Cardiovascular: Positive for leg swelling. Negative for chest pain and palpitations.  Gastrointestinal: Negative for abdominal pain and vomiting.  Genitourinary: Negative for dysuria and hematuria.  Musculoskeletal: Positive for back pain. Negative for arthralgias.  Skin: Negative for color change and rash.  Neurological: Negative for seizures, syncope and headaches.  Psychiatric/Behavioral: Negative for confusion.  All other systems reviewed and are negative.   Physical Exam Updated Vital Signs BP (!) 149/91 (BP Location: Right Arm)   Pulse 98   Temp 98.1 F (36.7 C)   Resp 20   Ht 6' (1.829 m)   Wt 91.6 kg   SpO2 97%   BMI 27.40 kg/m   Physical Exam Vitals and nursing note reviewed.  Constitutional:      General: He is not in acute distress.    Appearance: Normal appearance. He is well-developed. He is not ill-appearing, toxic-appearing or diaphoretic.  HENT:     Head: Normocephalic and atraumatic.     Nose: Nose normal.     Mouth/Throat:     Mouth: Mucous membranes are moist.  Eyes:     Conjunctiva/sclera: Conjunctivae normal.  Cardiovascular:     Rate and Rhythm: Normal rate and regular rhythm.     Heart sounds: Normal heart sounds. No murmur heard.   Pulmonary:     Effort: Pulmonary effort is normal. No respiratory distress.     Breath sounds: Normal breath sounds.  Abdominal:     General: Abdomen is flat.     Palpations: Abdomen is soft.     Tenderness: There is no abdominal tenderness.  Musculoskeletal:        General: Swelling present. No tenderness.     Cervical back: Neck supple.     Right lower leg: Edema present.     Left lower leg: Edema  present.     Comments: 2+ pitting edema in extremities bilaterally.  2+ DP pulses located via Doppler.  He has some mild erythema to his lower extremities bilaterally.  Move seen all 4 extremities without difficulty.  Midline tenderness to C, T, L-spine.  Some tenderness to the left SI joint.<2 cap refill to LE bilaterally.  He does have significant varicose veins to both lower extremities.  Sensations intact.  Skin:    General: Skin is warm and dry.  Capillary Refill: Capillary refill takes less than 2 seconds.  Neurological:     General: No focal deficit present.     Mental Status: He is alert and oriented to person, place, and time.     Sensory: No sensory deficit.     Motor: No weakness.  Psychiatric:        Mood and Affect: Mood normal.        Behavior: Behavior normal.     ED Results / Procedures / Treatments   Labs (all labs ordered are listed, but only abnormal results are displayed) Labs Reviewed  BASIC METABOLIC PANEL  CBC  BRAIN NATRIURETIC PEPTIDE    EKG None  Radiology No results found.  Procedures Procedures (including critical care time)  Medications Ordered in ED Medications - No data to display  ED Course  I have reviewed the triage vital signs and the nursing notes.  Pertinent labs & imaging results that were available during my care of the patient were reviewed by me and considered in my medical decision making (see chart for details).    MDM Rules/Calculators/A&P                         Patient with sciatica flareup and swelling to the lower extremities x1 week. On presentation, the patient is alert and oriented, nontoxic-appearing, no acute distress, resting comfortably in ER bed.  He is slightly hypertensive here in the ED, however other vitals are reassuring.  He has bilateral lower extremity 2+ edema with mild erythema and significant varicose veins.  2+ DP pulses located with Doppler.  No history of blood clots, not on blood thinners.  He has  been moving all 4 extremities without difficulty.  No history of CHF.  We will start with basic labs, BNP and DVT study.  I suspect his swelling is secondary to potential varicose veins, however will rule out DVT, new onset CHF.  No swelling, warmth, pain to ankles or knees, doubt gout or septic joint.  Doubt ischemic limbs as pulses are 2+ with<2 cap refill.  DVT study negative for clot.  BNP normal, CBC and BMP without significant abnormalities.  Pain treated with morphine, on reevaluation patient states that he notes mild to moderate improvement in his pain.  I suspect that his swelling could be due to vascular issues given his varicose veins.  He has no chest pain or shortness of breath, doubt PE.  I discussed the findings with the patient, he is overall reassured.  I encouraged him to get some compression stockings, elevate his legs, decrease salt intake.  I hesitate to give him another Medrol Dosepak or steroids given that he is actively getting epidural steroid injections.  He has pending follow-up with the pain doctor in July.  He is comfortable with discharge and following up with his PCP.  I also will provide a vascular referral.  Return precautions given.  All the patient's questions have been answered to his satisfaction, he voices understanding is agreeable to the plan.    Final Clinical Impression(s) / ED Diagnoses Final diagnoses:  Pain and swelling of lower extremity, unspecified laterality    Rx / DC Orders ED Discharge Orders    None       Lyndel Safe 09/05/19 1405    Isla Pence, MD 09/05/19 1500

## 2019-09-05 NOTE — ED Triage Notes (Signed)
Patient c/o left buttock pain that radiates into left leg. Patient states treated for sciatica x1 week ago with steroid injection. Patient states pain has returned and now has swelling in ankles bilaterally. Patient states taking percocet with no relief- last one taken approx 40 minutes ago.

## 2019-09-05 NOTE — ED Notes (Addendum)
Pt with pain left buttock down left leg.   Pt has seen doctor twice with steroid injection. Bilateral lower leg swelling for past 3 days.  Sciatica pain since October 2020.

## 2019-09-16 ENCOUNTER — Telehealth (HOSPITAL_COMMUNITY): Payer: Self-pay | Admitting: *Deleted

## 2019-09-16 NOTE — Telephone Encounter (Signed)
Patient called very upset that he received a letter from Dr. Modesta Messing stating he is discharged. Per pt she can not be the only one speaking for the entire office and company. Per pt he wants to know why he is being discharged. Informed patient that per his previus call he did not want to see provider anymore due to her not being an American and not being able to speak [place]. Pt then agreed with information and stated that his PCP was the one that referred him there and did not know that the provider that he would get would be a Dr. Parks Ranger don't speak English or understand English that well. Staff tried to offer him another place to go. Patient then preceded to talk negatively about the provider and then hung up.

## 2020-03-22 ENCOUNTER — Encounter (HOSPITAL_COMMUNITY): Payer: Self-pay | Admitting: Emergency Medicine

## 2020-03-22 ENCOUNTER — Emergency Department (HOSPITAL_COMMUNITY): Payer: Medicaid Other

## 2020-03-22 ENCOUNTER — Other Ambulatory Visit: Payer: Self-pay

## 2020-03-22 ENCOUNTER — Emergency Department (HOSPITAL_COMMUNITY)
Admission: EM | Admit: 2020-03-22 | Discharge: 2020-03-22 | Disposition: A | Discharge status: Home / Self Care | Payer: Medicaid Other | Attending: Emergency Medicine | Admitting: Emergency Medicine

## 2020-03-22 DIAGNOSIS — Z79899 Other long term (current) drug therapy: Secondary | ICD-10-CM | POA: Diagnosis not present

## 2020-03-22 DIAGNOSIS — M25462 Effusion, left knee: Secondary | ICD-10-CM | POA: Insufficient documentation

## 2020-03-22 DIAGNOSIS — I10 Essential (primary) hypertension: Secondary | ICD-10-CM | POA: Diagnosis not present

## 2020-03-22 DIAGNOSIS — M25562 Pain in left knee: Secondary | ICD-10-CM | POA: Diagnosis present

## 2020-03-22 DIAGNOSIS — Z87891 Personal history of nicotine dependence: Secondary | ICD-10-CM | POA: Insufficient documentation

## 2020-03-22 DIAGNOSIS — Z85828 Personal history of other malignant neoplasm of skin: Secondary | ICD-10-CM | POA: Insufficient documentation

## 2020-03-22 HISTORY — DX: Malignant (primary) neoplasm, unspecified: C80.1

## 2020-03-22 MED ORDER — OXYCODONE-ACETAMINOPHEN 5-325 MG PO TABS
2.0000 | ORAL_TABLET | Freq: Once | ORAL | Status: AC
  Administered 2020-03-22: 2 via ORAL
  Filled 2020-03-22: qty 2

## 2020-03-22 NOTE — Discharge Instructions (Signed)
You were evaluated today in the department for your knee pain.  There is some swelling in your knee, however your x-ray was reassuring that there are no broken bones or dislocations.  I recommend that you rest, ice the joint for 15 to 20 minutes 3 times a day.  Avoid any heavy lifting or heavy activity on the leg for the next few days.  You may use a knee brace or Ace bandage as needed.    Below is the contact information for Dr. Amedeo Kinsman, orthopedic surgeon here in [place].  May follow-up with him if you prefer to see an orthopedic specialist or your primary care doctor for this issue.  Return emergency department develop any worsening numbness, tingling in your legs, or if you develop any weakness in your lower extremities, or any other new severe symptoms.

## 2020-03-22 NOTE — ED Triage Notes (Signed)
Pt c/o left leg pain from the hip down with knee swelling.  Pt has seen pain mgmt and PCP without relief.

## 2020-03-22 NOTE — ED Provider Notes (Signed)
[facility] EMERGENCY DEPARTMENT Provider Note   CSN: [id number] Arrival date & time: 03/22/20  1142     History Chief Complaint  Patient presents with  . Leg Pain    Jesus Graham is a 55 y.o. male with history of chronic left leg back pain who presents with concern for swelling and pain in his left knee.  Patient states that he recently moved and has been moving all of his furniture in his belongings into his new home.  States that the pain began approximately 10 days ago around the time that he started moving mask and progressively worse, now with swelling to his knee.  Patient has history of significant injury with fracture of the tibial plateau and knee in 2004.  Patient has had multiple surgeries on that knee.  Patient has undergone multiple steroid injections and courses of oral steroids for lumbar radiculopathy and nerve pain in the left leg, without significant relief.  He denies any new numbness or tingling in his foot, denies weakness in his lower extremities.  Endorses sensation of burning shooting electric pain down his left leg when he fully extends his left knee.  States his pain completely resolves if he bends at the waist making his torso parallel to the ground.  He denies fevers or chills at home denies saddle anesthesia, urinary retention, difficulty starting his urine stream.  Patient states he has presented to both his pain management clinic and his primary care doctor with concern for this issue, without resolution.  Patient states he was directed to come to the emergency department today for an MRI and a steroid injection.  I personally reviewed this patient's medical records.  He has history of hypertension, anxiety, PTSD, depression malignant melanoma.  Patient under pain contract with his primary care doctor in [place], takes oxycodone at home 10 mg as needed.  HPI     Past Medical History:  Diagnosis Date  . Anxiety   . Back pain   . Cancer ([place])    skin  cancer  . Chronic pain of left knee   . Depression   . 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 HTN (hypertension)   . PTSD (post-traumatic stress disorder)     Patient Active Problem List   Diagnosis Date Noted  . Colorectal polyps   . GERD (gastroesophageal reflux disease) 02/11/2019  . Colon cancer screening 02/11/2019    Past Surgical History:  Procedure Laterality Date  . COLONOSCOPY WITH PROPOFOL N/A 05/26/2019   Procedure: COLONOSCOPY WITH PROPOFOL;  Surgeon: Danie Binder, MD;  [facility];  Service: Endoscopy;  Laterality: N/A;  8:45am  . left knee surgery     x 2  . POLYPECTOMY  05/26/2019   Procedure: POLYPECTOMY;  Surgeon: Danie Binder, MD;  Location: [facility];  Service: Endoscopy;;  . VASECTOMY         Family History  Problem Relation Age of Onset  . Alcohol abuse Father   . Colon cancer Neg Hx     Social History   Tobacco Use  . Smoking status: Former Smoker    Types: Cigarettes    Quit date: 03/13/1999    Years since quitting: 21.0  . Smokeless tobacco: Current User  . Tobacco comment: Dips occasionally  Vaping Use  . Vaping Use: Never used  Substance Use Topics  . Alcohol use: Not Currently  . Drug use: Never    Home Medications Prior to Admission  medications   Medication Sig Start Date End Date Taking? Authorizing Provider  amLODipine (NORVASC) 10 MG tablet Take 10 mg by mouth daily.    [provider]  diazepam (VALIUM) 2 MG tablet Take 4 mg by mouth daily in the afternoon.    [provider]  diazepam (VALIUM) 5 MG tablet Take 10 mg by mouth in the morning and at bedtime.    [provider]  DULoxetine (CYMBALTA) 60 MG capsule Take 60 mg by mouth daily.    [provider]  gabapentin (NEURONTIN) 300 MG capsule Take 300 mg by mouth 3 (three) times daily.     [provider]  HYDROcodone-acetaminophen (NORCO) 10-325 MG tablet Take 1 tablet by mouth every 6 (six) hours as  needed (pain.).    [provider]  risperiDONE (RISPERDAL) 0.5 MG tablet Take 1 tablet (0.5 mg total) by mouth at bedtime. 07/18/19   Norman Clay, MD  Tolnaftate (ABSORBINE JR EX) Apply 1 application topically 4 (four) times daily as needed (pain.).    [provider]    Allergies    Celexa [citalopram], Codeine, and Nsaids  Review of Systems   Review of Systems  Constitutional: Negative.   HENT: Negative.   Respiratory: Negative.   Cardiovascular: Negative.   Gastrointestinal: Negative.   Genitourinary: Negative.   Musculoskeletal: Positive for arthralgias, back pain and joint swelling.  Skin: Negative.   Neurological: Negative.  Negative for syncope and numbness.    Physical Exam Updated Vital Signs BP (!) 150/99 (BP Location: Left Arm)   Pulse 87   Temp 98.1 F (36.7 C) (Oral)   Resp 18   Ht 6' (1.829 m)   Wt 92.5 kg   SpO2 97%   BMI 27.67 kg/m   Physical Exam Vitals and nursing note reviewed.  Constitutional:      Appearance: He is normal weight.  HENT:     Head: Normocephalic and atraumatic.     Nose: Nose normal.     Mouth/Throat:     Mouth: Mucous membranes are moist.     Pharynx: No oropharyngeal exudate or posterior oropharyngeal erythema.  Eyes:     General:        Right eye: No discharge.        Left eye: No discharge.     Conjunctiva/sclera: Conjunctivae normal.     Pupils: Pupils are equal, round, and reactive to light.  Cardiovascular:     Rate and Rhythm: Normal rate and regular rhythm.     Pulses: Normal pulses.     Heart sounds: Normal heart sounds. No murmur heard.   Pulmonary:     Effort: Pulmonary effort is normal. No respiratory distress.     Breath sounds: Normal breath sounds. No wheezing or rales.  Abdominal:     General: Bowel sounds are normal. There is no distension.     Palpations: Abdomen is soft.     Tenderness: There is no abdominal tenderness.  Musculoskeletal:        General: No deformity.      Cervical back: Normal and neck supple. No spasms, tenderness, bony tenderness or crepitus. Normal range of motion.     Thoracic back: Normal. No tenderness or bony tenderness.     Lumbar back: Spasms present. No tenderness or bony tenderness. Negative right straight leg raise test and negative left straight leg raise test.     Right hip: Normal.     Left hip: Normal.     Right  upper leg: Normal.     Left upper leg: Normal.     Right knee: Normal.     Left knee: Swelling and effusion present. No bony tenderness or crepitus. Tenderness present.     Right lower leg: Normal. No edema.     Left lower leg: Normal. No edema.     Right ankle: Normal.     Right Achilles Tendon: Normal.     Left ankle: Normal.     Left Achilles Tendon: Normal.     Right foot: Normal.     Left foot: Normal.       Legs:     Comments: 5/5 strength in dorsi-plantar flexion bilaterally. FROM of the knees and hips bilaterally, pain elicited with full extension of the left knee.    Skin:    General: Skin is warm and dry.     Capillary Refill: Capillary refill takes less than 2 seconds.  Neurological:     General: No focal deficit present.     Mental Status: He is alert and oriented to person, place, and time. Mental status is at baseline.     Sensory: Sensation is intact.     Motor: Motor function is intact.     Gait: Gait is intact.     Comments: Patient walks with cane at baseline.  Psychiatric:        Mood and Affect: Mood normal.     ED Results / Procedures / Treatments   Labs (all labs ordered are listed, but only abnormal results are displayed) Labs Reviewed - No data to display  EKG None  Radiology DG Knee Complete 4 Views Left  Result Date: 03/22/2020 CLINICAL DATA:  Left knee pain and swelling. EXAM: LEFT KNEE - COMPLETE 4+ VIEW COMPARISON:  No prior. FINDINGS: Prominent knee joint effusion. No acute bony abnormality. No evidence of fracture. Tricompartment compartment degenerative change.  Tiny loose body cannot be excluded. IMPRESSION: 1. Prominent knee joint effusion. 2. Tricompartment compartment degenerative change. Tiny loose body cannot be excluded. No acute bony abnormality. Electronically Signed   By: Marcello Moores  Register   On: 03/22/2020 13:45    Procedures Procedures (including critical care time)  Medications Ordered in ED Medications  oxyCODONE-acetaminophen (PERCOCET/ROXICET) 5-325 MG per tablet 2 tablet (2 tablets Oral Given 03/22/20 1407)    ED Course  I have reviewed the triage vital signs and the nursing notes.  Pertinent labs & imaging results that were available during my care of the patient were reviewed by me and considered in my medical decision making (see chart for details).    MDM Rules/Calculators/A&P                         55 year old male who presents with concern for 10 days of worsening left knee pain and swelling; history of chronic pain in this leg following injury in 2004.    Patient was hypertensive on intake 138/106, vital signs otherwise normal.  Physical exam is very reassuring.  Patient is neurovascularly intact in lower extremities bilaterally.  There does appear to be an effusion of the left knee, as well as associated tenderness to palpation, however there is full range of motion of the hips, knees, and ankles bilaterally.  Patient's gait is intact, though he walks with a cane at baseline due to chronic pain of left leg. There is No sign of erythema, induration, warmth to the touch of the left knee to infectious etiology. No concern for cauda  equina syndrome at this time.  We will proceed with plain film of the left knee at this time; analgesia offered. Plain film of the knee revealed prominent knee joint effusion, degenerative changes, no acute bony abnormality.  Given reassuring physical exam, vital signs, and x-ray of the left knee, no further work-up is warranted in the emergency department at this time. Etiology for this patient's  symptoms does not appear to be emergent.  Suspect this patient's symptoms are secondary to known arthritis and degenerative changes in the knee; effusion likely secondary to increased activity in the knee with recent moving and transferring belongings to new home.  Given localized symptoms to the knee and distally, do not feel this is a lumbar radiculopathy; do not feel steroid burst would be appropriate at this time. Discussed with patient that an MRI is not warranted emergently at this time, and that he should follow-up with his primary care doctor or orthopedic provider to schedule an outpatient MRI of the knee if that is what they prefer.  He was agreeable with this plan, and appreciated reassurance with today's x-ray.  Kaydan voiced understanding of his medical evaluation and treatment plan.  His questions were answered to his expressed satisfaction.  Return precautions were given.  Patient is stable and appropriate for discharge at this time.  This chart was dictated using voice recognition software, Dragon. Despite the best efforts of this provider to proofread and correct errors, errors may still occur which can change documentation meaning.  Final Clinical Impression(s) / ED Diagnoses Final diagnoses:  Effusion of left knee    Rx / DC Orders ED Discharge Orders    None       Aura Dials 03/22/20 1545    Milton Ferguson, MD 03/25/20 [phone]

## 2020-04-25 ENCOUNTER — Other Ambulatory Visit: Payer: Self-pay

## 2020-04-25 ENCOUNTER — Ambulatory Visit (HOSPITAL_COMMUNITY): Payer: Medicaid Other | Attending: Family Medicine | Admitting: Physical Therapy

## 2020-04-25 ENCOUNTER — Encounter (HOSPITAL_COMMUNITY): Payer: Self-pay | Admitting: Physical Therapy

## 2020-04-25 DIAGNOSIS — R29898 Other symptoms and signs involving the musculoskeletal system: Secondary | ICD-10-CM

## 2020-04-25 DIAGNOSIS — R2689 Other abnormalities of gait and mobility: Secondary | ICD-10-CM | POA: Insufficient documentation

## 2020-04-25 DIAGNOSIS — M25562 Pain in left knee: Secondary | ICD-10-CM | POA: Diagnosis present

## 2020-04-25 DIAGNOSIS — M545 Low back pain, unspecified: Secondary | ICD-10-CM

## 2020-04-25 DIAGNOSIS — M6281 Muscle weakness (generalized): Secondary | ICD-10-CM | POA: Diagnosis present

## 2020-04-25 NOTE — Patient Instructions (Signed)
Access Code: [email or web address] URL: [email or web address] Date: 04/25/2020 Prepared by: [facility] Pessy Delamar  Exercises [employer] Up - 3 x daily - 7 x weekly - 2 sets - 10 reps - 2 seconds hold

## 2020-04-25 NOTE — Therapy (Signed)
[place], [place], [address] Phone: [phone]   Fax:  [phone]  Physical Therapy Evaluation  Patient Details  Name: Jesus Graham MRN: [id number] Date of Birth: 1965/04/06 Referring Provider (PT): Curtis Sites MD   Encounter Date: 04/25/2020   PT End of Session - 04/25/20 0957    Visit Number 1    Number of Visits 8    Date for PT Re-Evaluation 05/23/20    Authorization Type [employer]    Authorization Time Period 8 visits requested 2/14-3/14 - check auth    Authorization - Visit Number 1    Authorization - Number of Visits 1    PT Start Time 0915    PT Stop Time 0953    PT Time Calculation (min) 38 min    Activity Tolerance Patient tolerated treatment well;Patient limited by pain    Behavior During Therapy [facility] for tasks assessed/performed           Past Medical History:  Diagnosis Date  . Anxiety   . Back pain   . Cancer ([place])    skin cancer  . Chronic pain of left knee   . Depression   . History of alcoholism ([employer])    quit in 1995  . HTN (hypertension)   . PTSD (post-traumatic stress disorder)     Past Surgical History:  Procedure Laterality Date  . COLONOSCOPY WITH PROPOFOL N/A 05/26/2019   Procedure: COLONOSCOPY WITH PROPOFOL;  Surgeon: Danie Binder, MD;  [facility];  Service: Endoscopy;  Laterality: N/A;  8:45am  . left knee surgery     x 2  . POLYPECTOMY  05/26/2019   Procedure: POLYPECTOMY;  Surgeon: Danie Binder, MD;  [facility];  Service: Endoscopy;;  . VASECTOMY      There were no vitals filed for this visit.    Subjective Assessment - 04/25/20 0916    Subjective Patient is a 55 y.o. male who presents to physical therapy with c/o low back pain, chronic pain, radiculopathy of L, and L knee pain. Patient states his leg is burning and numb all the way through his leg. His symptoms are constant. He has a pinched nerve in his back. He had shots  in his back which helped a little and then came right back. He is having trouble walking, household chores, getting ready. He has some days that things get better. Stretching upright shoots pain down his leg. He tried chiro, shots, meds which didn't help. Patient states his main goal is to get an MRI. He noticed pain after stretching to clean a TV about 17 months ago.    Pertinent History chronic back pain, prior L knee injury    Limitations House hold activities;Standing;Walking;Lifting    How long can you walk comfortably? unable    Patient Stated Goals get MRI    Currently in Pain? Yes    Pain Score 9     Pain Location Back    Pain Orientation Left;Lower    Pain Descriptors / Indicators Burning;Numbness    Pain Type Chronic pain    Pain Onset More than a month ago    Pain Frequency Constant              [facility] PT Assessment - 04/25/20 0001      Assessment   Medical Diagnosis DDD lumbar, chronic pain, radiculopathy, pain in L knee    Referring Provider (PT) Curtis Sites MD  Onset Date/Surgical Date 12/11/18    Next MD Visit Feb 24th    Prior Therapy yes      Precautions   Precautions None      Restrictions   Weight Bearing Restrictions No      Balance Screen   Has the patient fallen in the past 6 months Yes    How many times? 3    Has the patient had a decrease in activity level because of a fear of falling?  No    Is the patient reluctant to leave their home because of a fear of falling?  No      Prior Function   Level of Independence Independent      Cognition   Overall Cognitive Status Within Functional Limits for tasks assessed      Observation/Other Assessments   Observations Ambulates with SPC, limited knee flexion/extension ROM    Focus on Therapeutic Outcomes (FOTO)  n/a      Sensation   Light Touch Impaired Detail    Light Touch Impaired Details Impaired LLE    Additional Comments decreased L3-L5 on LLE      ROM / Strength   AROM / PROM /  Strength AROM;Strength      AROM   Overall AROM Comments pain and increase in symptoms with flexion/ext/ lateral bending worst with lumbar extension    AROM Assessment Site Lumbar    Lumbar Flexion 25% limited    Lumbar Extension 25% limited    Lumbar - Right Side Bend 25% limited    Lumbar - Left Side Bend 50% limited    Lumbar - Right Rotation 25% limited    Lumbar - Left Rotation 0% limited      Strength   Strength Assessment Site Hip;Knee;Ankle    Right/Left Hip Right;Left    Right Hip Flexion 4+/5    Left Hip Flexion 4-/5    Right/Left Knee Right;Left    Right Knee Flexion 5/5    Right Knee Extension 5/5    Left Knee Flexion 4/5    Left Knee Extension 4-/5    Right/Left Ankle Right;Left    Right Ankle Dorsiflexion 5/5    Left Ankle Dorsiflexion 4-/5      Palpation   Spinal mobility hypomobile thoracic and lumbar spine, painful L2-L4    Palpation comment TTP L lumbar paraspinals, L piriformis, not tender throughout rest of back or glutes      Special Tests   Other special tests positive femoral nerve tension on LLE, negative RLE      Transfers   Comments slow, labored with use of hands, relies on RLE      Ambulation/Gait   Ambulation/Gait Yes    Ambulation/Gait Assistance 6: Modified independent (Device/Increase time)    Ambulation Distance (Feet) 325 Feet    Assistive device Straight cane    Gait Pattern Left flexed knee in stance;Decreased hip/knee flexion - left    Ambulation Surface Level;Indoor    Gait velocity decreased    Gait Comments 2MWT, fatigued following                      Objective measurements completed on examination: See above findings.       [facility] Adult PT Treatment/Exercise - 04/25/20 0001      Exercises   Exercises Lumbar      Lumbar Exercises: Stretches   Press Ups 10 reps  PT Education - 04/25/20 0916    Education Details Patient educated on exam findings, POC, scope of PT, HEP     Person(s) Educated Patient    Methods Explanation;Demonstration;Handout    Comprehension Verbalized understanding;Returned demonstration            PT Short Term Goals - 04/25/20 1214      PT SHORT TERM GOAL #1   Title Patient will be independent with HEP in order to improve functional outcomes.    Time 2    Period Weeks    Status New    Target Date 05/09/20      PT SHORT TERM GOAL #2   Title Patient will report at least 25% improvement in symptoms for improved quality of life.    Time 2    Period Weeks    Status New    Target Date 05/09/20             PT Long Term Goals - 04/25/20 1215      PT LONG TERM GOAL #1   Title Patient will report at least 75% improvement in symptoms for improved quality of life.    Time 4    Period Weeks    Status New    Target Date 05/23/20      PT LONG TERM GOAL #2   Title Patient will be able to ambulate at least 425 feet in 2MWT in order to demonstrate improved gait speed for community ambulation.    Time 4    Period Weeks    Status New    Target Date 05/23/20      PT LONG TERM GOAL #3   Title Patient will demonstrate at least 25% improvement in lumbar ROM in all restricted planes for improved ability to move trunk while completing chores.    Time 4    Period Weeks    Status New    Target Date 05/23/20                  Plan - 04/25/20 1204    Clinical Impression Statement Patient is a 55 y.o. male who presents to physical therapy with c/o low back pain, chronic pain, radiculopathy of L, and L knee pain. He presents with pain limited deficits in lumbar and LLE strength, ROM, endurance, postural impairments, spinal mobility, altered sensation, and functional mobility with ADL. He is having to modify and restrict ADL as indicated by subjective information and objective measures which is affecting overall participation. Patient will benefit from skilled physical therapy in order to improve function and reduce impairment.     Personal Factors and Comorbidities Fitness;Past/Current Experience;Behavior Pattern;Education;Time since onset of injury/illness/exacerbation;Social Background;Profession;Comorbidity 3+    Comorbidities chronic pain/ chronic back pain, L knee injuries, depression, anxiety    Examination-Activity Limitations Locomotion Level;Transfers;Bed Mobility;Bend;Lift;Stairs;Squat    Examination-Participation Restrictions Cleaning;Meal Prep;Occupation;Community Activity;Volunteer;Shop;Yard Work;Laundry    Stability/Clinical Decision Making Evolving/Moderate complexity    Clinical Decision Making Moderate    Rehab Potential Fair    PT Frequency 2x / week    PT Duration 4 weeks    PT Treatment/Interventions ADLs/Self Care Home Management;Aquatic Therapy;Electrical Stimulation;Cryotherapy;Iontophoresis 4mg /ml Dexamethasone;Moist Heat;Traction;Ultrasound;DME Instruction;Gait training;Stair training;Functional mobility training;Therapeutic activities;Therapeutic exercise;Balance training;Neuromuscular re-education;Patient/family education;Orthotic Fit/Training;Manual techniques;Scar mobilization;Passive range of motion;Dry needling;Energy conservation;Splinting;Taping;Spinal Manipulations;Joint Manipulations    PT Next Visit Plan f/u press up and discontinue if worse, trial piriformis stretch, SKTC, begin core strengthening, further test glute strength, probably initiate hip strengthening, LLE strengthening    PT Home Exercise Plan 2/14  Press up    [employer] and Agree with Plan of Care Patient           Patient will benefit from skilled therapeutic intervention in order to improve the following deficits and impairments:  Abnormal gait,Difficulty walking,Decreased range of motion,Decreased endurance,Increased muscle spasms,Decreased activity tolerance,Pain,Impaired perceived functional ability,Decreased balance,Hypomobility,Improper body mechanics,Impaired flexibility,Decreased mobility,Decreased strength,Postural  dysfunction  Visit Diagnosis: Low back pain, unspecified back pain laterality, unspecified chronicity, unspecified whether sciatica present  Muscle weakness (generalized)  Other abnormalities of gait and mobility  Other symptoms and signs involving the musculoskeletal system  Left knee pain, unspecified chronicity     Problem List Patient Active Problem List   Diagnosis Date Noted  . Colorectal polyps   . GERD (gastroesophageal reflux disease) 02/11/2019  . Colon cancer screening 02/11/2019   12:17 PM, 04/25/20 Mearl Latin PT, DPT Physical Therapist at [place], [place], [address] Phone: [phone]   Fax:  [phone]  Name: Jesus Graham MRN: [id number] Date of Birth: Jul 28, 1965

## 2020-04-29 ENCOUNTER — Other Ambulatory Visit: Payer: Self-pay

## 2020-04-29 ENCOUNTER — Ambulatory Visit (HOSPITAL_COMMUNITY): Payer: Medicaid Other

## 2020-04-29 DIAGNOSIS — R2689 Other abnormalities of gait and mobility: Secondary | ICD-10-CM

## 2020-04-29 DIAGNOSIS — M545 Low back pain, unspecified: Secondary | ICD-10-CM | POA: Diagnosis not present

## 2020-04-29 DIAGNOSIS — M6281 Muscle weakness (generalized): Secondary | ICD-10-CM

## 2020-04-29 DIAGNOSIS — R29898 Other symptoms and signs involving the musculoskeletal system: Secondary | ICD-10-CM

## 2020-04-29 NOTE — Therapy (Signed)
[place], [place], [address] Phone: [phone]   Fax:  [phone]  Physical Therapy Treatment  Patient Details  Name: Jesus Graham MRN: [id number] Date of Birth: 1966/03/12 Referring Provider (PT): Curtis Sites MD   Encounter Date: 04/29/2020   PT End of Session - 04/29/20 1356    Visit Number 2    Number of Visits 8    Date for PT Re-Evaluation 05/23/20    Authorization Type [employer]    Authorization Time Period 8 visits requested 2/14-3/14 - check auth    Authorization - Visit Number 1    Authorization - Number of Visits 1    PT Start Time 8299    PT Stop Time 1433    PT Time Calculation (min) 41 min    Activity Tolerance Patient tolerated treatment well;Patient limited by pain    Behavior During Therapy [facility] for tasks assessed/performed           Past Medical History:  Diagnosis Date  . Anxiety   . Back pain   . Cancer ([place])    skin cancer  . Chronic pain of left knee   . Depression   . 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 HTN (hypertension)   . PTSD (post-traumatic stress disorder)     Past Surgical History:  Procedure Laterality Date  . COLONOSCOPY WITH PROPOFOL N/A 05/26/2019   Procedure: COLONOSCOPY WITH PROPOFOL;  Surgeon: Danie Binder, MD;  [facility];  Service: Endoscopy;  Laterality: N/A;  8:45am  . left knee surgery     x 2  . POLYPECTOMY  05/26/2019   Procedure: POLYPECTOMY;  Surgeon: Danie Binder, MD;  [facility];  Service: Endoscopy;;  . VASECTOMY      There were no vitals filed for this visit.   Subjective Assessment - 04/29/20 1354    Subjective Patient reports increased LLE pain and increased pain with weight bearing on LLE and outlines low back, buttock, and dorsum of foot    Pertinent History chronic back pain, prior L knee injury    Limitations House hold activities;Standing;Walking;Lifting    How long can you walk  comfortably? unable    Patient Stated Goals get MRI    Currently in Pain? Yes    Pain Score 9     Pain Location Leg    Pain Orientation Left;Lower    Pain Descriptors / Indicators Burning;Numbness    Pain Type Chronic pain    Pain Onset More than a month ago                             [facility] Adult PT Treatment/Exercise - 04/29/20 0001      Lumbar Exercises: Prone   Other Prone Lumbar Exercises prone x 5-10 min      Manual Therapy   Manual Therapy Joint mobilization;Manual Traction    Manual therapy comments Performed independently of all other interventions    Joint Mobilization lumbar P-A grade 3. Gapping mobilization for lumbar spine with pt in prone    Manual Traction initiated with pt in supine and knees/hips flexed on stability ball performing manual traction but without desired effect.  Placed in prone and performed LAD with pt experiencing decompression in prone position                  PT Education - 04/29/20  1430    Education Details pt education in posture and body mechanics and lumbar spine anatomy and benefits of lumbar support    Person(s) Educated Patient    Methods Explanation;Demonstration    Comprehension Verbalized understanding            PT Short Term Goals - 04/25/20 1214      PT SHORT TERM GOAL #1   Title Patient will be independent with HEP in order to improve functional outcomes.    Time 2    Period Weeks    Status New    Target Date 05/09/20      PT SHORT TERM GOAL #2   Title Patient will report at least 25% improvement in symptoms for improved quality of life.    Time 2    Period Weeks    Status New    Target Date 05/09/20             PT Long Term Goals - 04/25/20 1215      PT LONG TERM GOAL #1   Title Patient will report at least 75% improvement in symptoms for improved quality of life.    Time 4    Period Weeks    Status New    Target Date 05/23/20      PT LONG TERM GOAL #2   Title Patient will be  able to ambulate at least 425 feet in 2MWT in order to demonstrate improved gait speed for community ambulation.    Time 4    Period Weeks    Status New    Target Date 05/23/20      PT LONG TERM GOAL #3   Title Patient will demonstrate at least 25% improvement in lumbar ROM in all restricted planes for improved ability to move trunk while completing chores.    Time 4    Period Weeks    Status New    Target Date 05/23/20                 Plan - 04/29/20 1439    Clinical Impression Statement Pt reports increase in LLE pain and difficulty in walking.  Patient reports decrease in pain following tx session employing various manual techniques and demo good recall with prone positioning and rationale for lumbar biomechanics. Good respone to prone position and traction in prone with reports of some decompression experienced.  Continued tx indicated to assess if centralization of symptoms can occur    Personal Factors and Comorbidities Fitness;Past/Current Experience;Behavior Pattern;Education;Time since onset of injury/illness/exacerbation;Social Background;Profession;Comorbidity 3+    Comorbidities chronic pain/ chronic back pain, L knee injuries, depression, anxiety    Examination-Activity Limitations Locomotion Level;Transfers;Bed Mobility;Bend;Lift;Stairs;Squat    Examination-Participation Restrictions Cleaning;Meal Prep;Occupation;Community Activity;Volunteer;Shop;Yard Work;Laundry    Stability/Clinical Decision Making Evolving/Moderate complexity    Rehab Potential Fair    PT Frequency 2x / week    PT Duration 4 weeks    PT Treatment/Interventions ADLs/Self Care Home Management;Aquatic Therapy;Electrical Stimulation;Cryotherapy;Iontophoresis 4mg /ml Dexamethasone;Moist Heat;Traction;Ultrasound;DME Instruction;Gait training;Stair training;Functional mobility training;Therapeutic activities;Therapeutic exercise;Balance training;Neuromuscular re-education;Patient/family education;Orthotic  Fit/Training;Manual techniques;Scar mobilization;Passive range of motion;Dry needling;Energy conservation;Splinting;Taping;Spinal Manipulations;Joint Manipulations    PT Next Visit Plan f/u press up and discontinue if worse, trial piriformis stretch, SKTC, begin core strengthening, further test glute strength, probably initiate hip strengthening, LLE strengthening. Good response to prone position and prone traction, trial MECHANICAL TRACTION?    PT Home Exercise Plan 2/14 Press up. SItting with lumbar support    Consulted and Agree with Plan of Care Patient  Patient will benefit from skilled therapeutic intervention in order to improve the following deficits and impairments:  Abnormal gait,Difficulty walking,Decreased range of motion,Decreased endurance,Increased muscle spasms,Decreased activity tolerance,Pain,Impaired perceived functional ability,Decreased balance,Hypomobility,Improper body mechanics,Impaired flexibility,Decreased mobility,Decreased strength,Postural dysfunction  Visit Diagnosis: Low back pain, unspecified back pain laterality, unspecified chronicity, unspecified whether sciatica present  Muscle weakness (generalized)  Other abnormalities of gait and mobility  Other symptoms and signs involving the musculoskeletal system     Problem List Patient Active Problem List   Diagnosis Date Noted  . Colorectal polyps   . GERD (gastroesophageal reflux disease) 02/11/2019  . Colon cancer screening 02/11/2019    3:26 PM, 04/29/20 M. Sherlyn Lees, PT, DPT Physical Therapist- [place] Office Number: [phone]  [place] [address] [place], [place], [address] Phone: [phone]   Fax:  [phone]  Name: Jesus Graham MRN: [id number] Date of Birth: 27-Oct-1965

## 2020-05-03 ENCOUNTER — Ambulatory Visit (HOSPITAL_COMMUNITY): Payer: Medicaid Other | Admitting: Physical Therapy

## 2020-05-03 ENCOUNTER — Other Ambulatory Visit: Payer: Self-pay

## 2020-05-03 ENCOUNTER — Encounter (HOSPITAL_COMMUNITY): Payer: Self-pay | Admitting: Physical Therapy

## 2020-05-03 DIAGNOSIS — M545 Low back pain, unspecified: Secondary | ICD-10-CM

## 2020-05-03 DIAGNOSIS — R29898 Other symptoms and signs involving the musculoskeletal system: Secondary | ICD-10-CM

## 2020-05-03 DIAGNOSIS — R2689 Other abnormalities of gait and mobility: Secondary | ICD-10-CM

## 2020-05-03 DIAGNOSIS — M6281 Muscle weakness (generalized): Secondary | ICD-10-CM

## 2020-05-03 DIAGNOSIS — M25562 Pain in left knee: Secondary | ICD-10-CM

## 2020-05-03 NOTE — Patient Instructions (Signed)
Access Code: [email or web address] URL: [email or web address] Date: 05/03/2020 Prepared by: [facility] Kamri Gotsch  Exercises Seated Long Arc Quad - 1 x daily - 7 x weekly - 10 reps - 10 second hold Supine Bridge - 1 x daily - 7 x weekly - 2 sets - 10 reps

## 2020-05-03 NOTE — Therapy (Signed)
[place], [place], [address] Phone: [phone]   Fax:  [phone]  Physical Therapy Treatment  Patient Details  Name: Jesus Graham MRN: [id number] Date of Birth: [birth year] Referring Provider (PT): Curtis Sites MD   Encounter Date: 05/03/2020   PT End of Session - 05/03/20 0931    Visit Number 3    Number of Visits 8    Date for PT Re-Evaluation 05/23/20    Authorization Type [employer]    Authorization Time Period 8 visits requested 2/14-3/14 - check auth    Authorization - Visit Number 1    Authorization - Number of Visits 1    PT Start Time 0930   arrives late   PT Stop Time 0955    PT Time Calculation (min) 25 min    Activity Tolerance Patient tolerated treatment well;Patient limited by pain    Behavior During Therapy [facility] for tasks assessed/performed           Past Medical History:  Diagnosis Date  . Anxiety   . Back pain   . Cancer ([place])    skin cancer  . Chronic pain of left knee   . Depression   . History of alcoholism ([place])    quit in 1995  . HTN (hypertension)   . PTSD (post-traumatic stress disorder)     Past Surgical History:  Procedure Laterality Date  . COLONOSCOPY WITH PROPOFOL N/A 05/26/2019   Procedure: COLONOSCOPY WITH PROPOFOL;  Surgeon: Danie Binder, MD;  [facility];  Service: Endoscopy;  Laterality: N/A;  8:45am  . left knee surgery     x 2  . POLYPECTOMY  05/26/2019   Procedure: POLYPECTOMY;  Surgeon: Danie Binder, MD;  [facility];  Service: Endoscopy;;  . VASECTOMY      There were no vitals filed for this visit.   Subjective Assessment - 05/03/20 0932    Subjective Patient states his leg is numb. The traction made his back hurt where his kidney is and its been sore there still. His leg wasnt hurting yesterday and today its very bad. Back isnt too bad today but leg is.    Pertinent History chronic back pain, prior L knee injury     Limitations House hold activities;Standing;Walking;Lifting    How long can you walk comfortably? unable    Patient Stated Goals get MRI    Currently in Pain? Yes    Pain Score 9     Pain Location Leg    Pain Orientation Left;Lower    Pain Onset More than a month ago                             [facility] Adult PT Treatment/Exercise - 05/03/20 0001      Lumbar Exercises: Stretches   Single Knee to Chest Stretch Left;3 reps;20 seconds    Press Ups 10 reps      Lumbar Exercises: Seated   Long Arc Quad on Chair Left;1 set;10 reps    LAQ on Chair Weights (lbs) 10 second holds      Lumbar Exercises: Supine   Glut Set 5 reps;5 seconds    Bridge 10 reps    Bridge Limitations 2 sets                  PT Education - 05/03/20 0931    Education Details HEP, exercise mechanics  Person(s) Educated Patient    Methods Explanation;Demonstration    Comprehension Verbalized understanding;Returned demonstration            PT Short Term Goals - 04/25/20 1214      PT SHORT TERM GOAL #1   Title Patient will be independent with HEP in order to improve functional outcomes.    Time 2    Period Weeks    Status New    Target Date 05/09/20      PT SHORT TERM GOAL #2   Title Patient will report at least 25% improvement in symptoms for improved quality of life.    Time 2    Period Weeks    Status New    Target Date 05/09/20             PT Long Term Goals - 04/25/20 1215      PT LONG TERM GOAL #1   Title Patient will report at least 75% improvement in symptoms for improved quality of life.    Time 4    Period Weeks    Status New    Target Date 05/23/20      PT LONG TERM GOAL #2   Title Patient will be able to ambulate at least 425 feet in 2MWT in order to demonstrate improved gait speed for community ambulation.    Time 4    Period Weeks    Status New    Target Date 05/23/20      PT LONG TERM GOAL #3   Title Patient will demonstrate at least 25%  improvement in lumbar ROM in all restricted planes for improved ability to move trunk while completing chores.    Time 4    Period Weeks    Status New    Target Date 05/23/20                 Plan - 05/03/20 0931    Clinical Impression Statement Session limited by patient's late arrival. Patient requires cueing for relaxing lumbar paraspinals with press up due to c/o increased LLE symptoms. Patient states burning and cold symptoms with repeated press ups. Patient with c/o increased symptoms with [facility] with increased hip flexion. Patient continues to test limits throughout session to provoke symptoms. Patient given cueing for prior glute activation with bridges with good carry over. Patient will continue to benefit from skilled physical therapy to reduce impairment and improve function.    Personal Factors and Comorbidities Fitness;Past/Current Experience;Behavior Pattern;Education;Time since onset of injury/illness/exacerbation;Social Background;Profession;Comorbidity 3+    Comorbidities chronic pain/ chronic back pain, L knee injuries, depression, anxiety    Examination-Activity Limitations Locomotion Level;Transfers;Bed Mobility;Bend;Lift;Stairs;Squat    Examination-Participation Restrictions Cleaning;Meal Prep;Occupation;Community Activity;Volunteer;Shop;Yard Work;Laundry    Stability/Clinical Decision Making Evolving/Moderate complexity    Rehab Potential Fair    PT Frequency 2x / week    PT Duration 4 weeks    PT Treatment/Interventions ADLs/Self Care Home Management;Aquatic Therapy;Electrical Stimulation;Cryotherapy;Iontophoresis 4mg /ml Dexamethasone;Moist Heat;Traction;Ultrasound;DME Instruction;Gait training;Stair training;Functional mobility training;Therapeutic activities;Therapeutic exercise;Balance training;Neuromuscular re-education;Patient/family education;Orthotic Fit/Training;Manual techniques;Scar mobilization;Passive range of motion;Dry needling;Energy  conservation;Splinting;Taping;Spinal Manipulations;Joint Manipulations    PT Next Visit Plan begin core strengthening, further test glute strength, probably initiate hip strengthening, LLE strengthening. possibly continue prone traction in future sessions.    PT Home Exercise Plan 2/14 Press up. SItting with lumbar support    Consulted and Agree with Plan of Care Patient           Patient will benefit from skilled therapeutic intervention in order to improve the  following deficits and impairments:  Abnormal gait,Difficulty walking,Decreased range of motion,Decreased endurance,Increased muscle spasms,Decreased activity tolerance,Pain,Impaired perceived functional ability,Decreased balance,Hypomobility,Improper body mechanics,Impaired flexibility,Decreased mobility,Decreased strength,Postural dysfunction  Visit Diagnosis: Low back pain, unspecified back pain laterality, unspecified chronicity, unspecified whether sciatica present  Muscle weakness (generalized)  Other abnormalities of gait and mobility  Other symptoms and signs involving the musculoskeletal system  Left knee pain, unspecified chronicity     Problem List Patient Active Problem List   Diagnosis Date Noted  . Colorectal polyps   . GERD (gastroesophageal reflux disease) 02/11/2019  . Colon cancer screening 02/11/2019    9:58 AM, 05/03/20 Mearl Latin PT, DPT Physical Therapist at [place], [place], [address] Phone: [phone]   Fax:  [phone]  Name: Jesus Graham MRN: [id number] Date of Birth: March 11, 1966

## 2020-05-05 ENCOUNTER — Ambulatory Visit (HOSPITAL_COMMUNITY): Payer: Medicaid Other | Admitting: Physical Therapy

## 2020-05-09 ENCOUNTER — Telehealth (HOSPITAL_COMMUNITY): Payer: Self-pay | Admitting: Physical Therapy

## 2020-05-09 ENCOUNTER — Ambulatory Visit (HOSPITAL_COMMUNITY): Payer: Medicaid Other | Admitting: Physical Therapy

## 2020-05-09 ENCOUNTER — Encounter (HOSPITAL_COMMUNITY): Payer: Self-pay

## 2020-05-09 NOTE — Telephone Encounter (Signed)
Patient no show, called patient to make him aware of missed appointment and patient stated he would be at next appointment.  10:18 AM, 05/09/20 Mearl Latin PT, DPT Physical Therapist at [facility]

## 2020-05-11 ENCOUNTER — Telehealth (HOSPITAL_COMMUNITY): Payer: Self-pay

## 2020-05-11 ENCOUNTER — Ambulatory Visit (HOSPITAL_COMMUNITY): Payer: Medicaid Other

## 2020-05-11 NOTE — Telephone Encounter (Signed)
pt called to cx due to he stated that he can hardly walk

## 2020-05-17 ENCOUNTER — Other Ambulatory Visit: Payer: Self-pay

## 2020-05-17 ENCOUNTER — Ambulatory Visit (HOSPITAL_COMMUNITY): Payer: Medicaid Other | Attending: Family Medicine

## 2020-05-17 DIAGNOSIS — M545 Low back pain, unspecified: Secondary | ICD-10-CM | POA: Insufficient documentation

## 2020-05-17 DIAGNOSIS — M25562 Pain in left knee: Secondary | ICD-10-CM | POA: Diagnosis present

## 2020-05-17 DIAGNOSIS — R29898 Other symptoms and signs involving the musculoskeletal system: Secondary | ICD-10-CM | POA: Diagnosis present

## 2020-05-17 DIAGNOSIS — R2689 Other abnormalities of gait and mobility: Secondary | ICD-10-CM | POA: Diagnosis present

## 2020-05-17 DIAGNOSIS — M6281 Muscle weakness (generalized): Secondary | ICD-10-CM | POA: Insufficient documentation

## 2020-05-17 NOTE — Therapy (Signed)
[place], [place], [address] Phone: [phone]   Fax:  [phone]  Physical Therapy Treatment  Patient Details  Name: Jesus Graham MRN: [id number] Date of Birth: 01-25-1966 Referring Provider (PT): Curtis Sites MD   Encounter Date: 05/17/2020   PT End of Session - 05/17/20 0857    Visit Number 4    Number of Visits 8    Date for PT Re-Evaluation 05/23/20    Authorization Type [employer]    Authorization Time Period 8 visits requested 2/14-3/14 - check auth    Authorization - Visit Number 1    Authorization - Number of Visits 1    PT Start Time 6962    PT Stop Time 0943    PT Time Calculation (min) 45 min    Activity Tolerance Patient tolerated treatment well;Patient limited by pain    Behavior During Therapy [facility] for tasks assessed/performed           Past Medical History:  Diagnosis Date  . Anxiety   . Back pain   . Cancer ([place])    skin cancer  . Chronic pain of left knee   . Depression   . History of alcoholism ([place])    quit in 1995  . HTN (hypertension)   . PTSD (post-traumatic stress disorder)     Past Surgical History:  Procedure Laterality Date  . COLONOSCOPY WITH PROPOFOL N/A 05/26/2019   Procedure: COLONOSCOPY WITH PROPOFOL;  Surgeon: Danie Binder, MD;  [facility];  Service: Endoscopy;  Laterality: N/A;  8:45am  . left knee surgery     x 2  . POLYPECTOMY  05/26/2019   Procedure: POLYPECTOMY;  Surgeon: Danie Binder, MD;  [facility];  Service: Endoscopy;;  . VASECTOMY      There were no vitals filed for this visit.   Subjective Assessment - 05/17/20 0859    Subjective Patient reports back and leg pain have become worse and that his entire left foot is numb. Patient notes increase pain as day progresses and when he has to carry items.  Patient reports he has MD appointment on 05/24/20    Pertinent History chronic back pain, prior L knee injury     Limitations House hold activities;Standing;Walking;Lifting    How long can you walk comfortably? unable    Patient Stated Goals get MRI    Currently in Pain? Yes    Pain Score 8     Pain Location Leg    Pain Orientation Left;Lower    Pain Descriptors / Indicators Burning;Numbness    Pain Type Chronic pain    Pain Onset More than a month ago    Pain Frequency Constant    Aggravating Factors  anything    Pain Relieving Factors "nothing"              [facility] PT Assessment - 05/17/20 0001      Assessment   Medical Diagnosis DDD lumbar, chronic pain, radiculopathy, pain in L knee    Referring Provider (PT) Curtis Sites MD    Next MD Visit 05/24/20      Sensation   Light Touch Impaired Detail    Light Touch Impaired Details Impaired LLE    Additional Comments decreased L3-L5 on LLE      Strength   Left Hip Flexion 4/5    Left Knee Flexion 4/5    Left Knee Extension 4/5    Right Ankle Dorsiflexion 5/5  Left Ankle Dorsiflexion 4/5    Left Ankle Plantar Flexion 4/5                         [facility] Adult PT Treatment/Exercise - 05/17/20 0001      Lumbar Exercises: Stretches   Passive Hamstring Stretch Left;5 reps;60 seconds   with sheet and report of onset of parasthesia   Single Knee to Chest Stretch Right;Left;2 reps;60 seconds    Double Knee to Chest Stretch 2 reps;60 seconds      Lumbar Exercises: Prone   Other Prone Lumbar Exercises prone with 2 pillows x 10 min    Other Prone Lumbar Exercises return to prone flat x 5 min after passive hamstring stretch to see if parasthesias abate x 5 min                  PT Education - 05/17/20 0910    Education Details Pt education in positioning techniques    Person(s) Educated Patient    Methods Explanation;Demonstration    Comprehension Verbalized understanding;Returned demonstration            PT Short Term Goals - 04/25/20 1214      PT SHORT TERM GOAL #1   Title Patient will be independent  with HEP in order to improve functional outcomes.    Time 2    Period Weeks    Status New    Target Date 05/09/20      PT SHORT TERM GOAL #2   Title Patient will report at least 25% improvement in symptoms for improved quality of life.    Time 2    Period Weeks    Status New    Target Date 05/09/20             PT Long Term Goals - 04/25/20 1215      PT LONG TERM GOAL #1   Title Patient will report at least 75% improvement in symptoms for improved quality of life.    Time 4    Period Weeks    Status New    Target Date 05/23/20      PT LONG TERM GOAL #2   Title Patient will be able to ambulate at least 425 feet in 2MWT in order to demonstrate improved gait speed for community ambulation.    Time 4    Period Weeks    Status New    Target Date 05/23/20      PT LONG TERM GOAL #3   Title Patient will demonstrate at least 25% improvement in lumbar ROM in all restricted planes for improved ability to move trunk while completing chores.    Time 4    Period Weeks    Status New    Target Date 05/23/20                 Plan - 05/17/20 0912    Clinical Impression Statement Patient reports continual pain in his low back and LLE with reports of numbness encompassing his entire left foot and along posterior left thigh, buttocks, and low back. No significant motor deficits noted and no tone abnormalities RLE or LLE appreciated but pt report of decreased sensation to light touch and two-point discrimination LLE. Patient notes decreased parasthesias following return to prone position but nothing that provides lasting relief    Personal Factors and Comorbidities Fitness;Past/Current Experience;Behavior Pattern;Education;Time since onset of injury/illness/exacerbation;Social Background;Profession;Comorbidity 3+    Comorbidities chronic pain/ chronic back pain,  L knee injuries, depression, anxiety    Examination-Activity Limitations Locomotion Level;Transfers;Bed  Mobility;Bend;Lift;Stairs;Squat    Examination-Participation Restrictions Cleaning;Meal Prep;Occupation;Community Activity;Volunteer;Shop;Yard Work;Laundry    Stability/Clinical Decision Making Evolving/Moderate complexity    Rehab Potential Fair    PT Frequency 2x / week    PT Duration 4 weeks    PT Treatment/Interventions ADLs/Self Care Home Management;Aquatic Therapy;Electrical Stimulation;Cryotherapy;Iontophoresis 4mg /ml Dexamethasone;Moist Heat;Traction;Ultrasound;DME Instruction;Gait training;Stair training;Functional mobility training;Therapeutic activities;Therapeutic exercise;Balance training;Neuromuscular re-education;Patient/family education;Orthotic Fit/Training;Manual techniques;Scar mobilization;Passive range of motion;Dry needling;Energy conservation;Splinting;Taping;Spinal Manipulations;Joint Manipulations    PT Next Visit Plan begin core strengthening, further test glute strength, probably initiate hip strengthening, LLE strengthening. possibly continue prone traction in future sessions.    PT Home Exercise Plan 2/14 Press up. SItting with lumbar support    Consulted and Agree with Plan of Care Patient           Patient will benefit from skilled therapeutic intervention in order to improve the following deficits and impairments:  Abnormal gait,Difficulty walking,Decreased range of motion,Decreased endurance,Increased muscle spasms,Decreased activity tolerance,Pain,Impaired perceived functional ability,Decreased balance,Hypomobility,Improper body mechanics,Impaired flexibility,Decreased mobility,Decreased strength,Postural dysfunction  Visit Diagnosis: Low back pain, unspecified back pain laterality, unspecified chronicity, unspecified whether sciatica present  Muscle weakness (generalized)  Other abnormalities of gait and mobility  Other symptoms and signs involving the musculoskeletal system  Left knee pain, unspecified chronicity     Problem List Patient Active  Problem List   Diagnosis Date Noted  . Colorectal polyps   . GERD (gastroesophageal reflux disease) 02/11/2019  . Colon cancer screening 02/11/2019   9:53 AM, 05/17/20 M. Sherlyn Lees, PT, DPT Physical Therapist- [place] Office Number: [phone]  [place] [address] [place], [place], [address] Phone: [phone]   Fax:  [phone]  Name: Fredrik Mogel MRN: [id number] Date of Birth: 1965-10-12

## 2020-05-19 ENCOUNTER — Other Ambulatory Visit: Payer: Self-pay

## 2020-05-19 ENCOUNTER — Ambulatory Visit (HOSPITAL_COMMUNITY): Payer: Medicaid Other | Admitting: Physical Therapy

## 2020-05-19 ENCOUNTER — Encounter (HOSPITAL_COMMUNITY): Payer: Self-pay | Admitting: Physical Therapy

## 2020-05-19 DIAGNOSIS — R29898 Other symptoms and signs involving the musculoskeletal system: Secondary | ICD-10-CM

## 2020-05-19 DIAGNOSIS — M6281 Muscle weakness (generalized): Secondary | ICD-10-CM

## 2020-05-19 DIAGNOSIS — M545 Low back pain, unspecified: Secondary | ICD-10-CM

## 2020-05-19 DIAGNOSIS — R2689 Other abnormalities of gait and mobility: Secondary | ICD-10-CM

## 2020-05-19 DIAGNOSIS — M25562 Pain in left knee: Secondary | ICD-10-CM

## 2020-05-19 NOTE — Therapy (Signed)
Jesus Graham [address] [place], [place], [address] Phone: [phone]   Fax:  [phone]  Physical Therapy Treatment/Discharge Summary  Patient Details  Name: Jesus Graham MRN: [id number] Date of Birth: [birth year] Referring Provider (PT): Curtis Sites MD   Encounter Date: 05/19/2020  PHYSICAL THERAPY DISCHARGE SUMMARY  Visits from Start of Care: 5  Current functional level related to goals / functional outcomes: See below   Remaining deficits: See below   Education / Equipment: See below  Plan: Patient agrees to discharge.  Patient goals were not met. Patient is being discharged due to lack of progress.  ?????        PT End of Session - 05/19/20 0919    Visit Number 5    Number of Visits 8    Date for PT Re-Evaluation 05/23/20    Authorization Type [employer]    Authorization Time Period 8 visits requested 2/14-3/14 - check auth    Authorization - Visit Number 1    Authorization - Number of Visits 1    PT Start Time 0919    PT Stop Time 0934    PT Time Calculation (min) 15 min    Activity Tolerance Patient tolerated treatment well;Patient limited by pain    Behavior During Therapy [facility] for tasks assessed/performed           Past Medical History:  Diagnosis Date  . Anxiety   . Back pain   . Cancer ([place])    skin cancer  . Chronic pain of left knee   . Depression   . History of alcoholism ([place])    quit in 1995  . HTN (hypertension)   . PTSD (post-traumatic stress disorder)     Past Surgical History:  Procedure Laterality Date  . COLONOSCOPY WITH PROPOFOL N/A 05/26/2019   Procedure: COLONOSCOPY WITH PROPOFOL;  Surgeon: Danie Binder, MD;  [facility];  Service: Endoscopy;  Laterality: N/A;  8:45am  . left knee surgery     x 2  . POLYPECTOMY  05/26/2019   Procedure: POLYPECTOMY;  Surgeon: Danie Binder, MD;  Location: [facility];  Service: Endoscopy;;  . VASECTOMY      There  were no vitals filed for this visit.   Subjective Assessment - 05/19/20 0920    Subjective Patient continues to have same symptoms. Patient has not had any improvement with PT intervention. He didn't feel like PT would help much. His whole foot is numb. Leg is burning. Back is still bothering him.    Pertinent History chronic back pain, prior L knee injury    Limitations House hold activities;Standing;Walking;Lifting    How long can you walk comfortably? unable    Patient Stated Goals get MRI    Currently in Pain? Yes    Pain Score 7     Pain Location Leg    Pain Orientation Left    Pain Descriptors / Indicators Burning;Numbness    Pain Type Chronic pain    Pain Onset More than a month ago              [facility] PT Assessment - 05/19/20 0001      Assessment   Medical Diagnosis DDD lumbar, chronic pain, radiculopathy, pain in L knee    Referring Provider (PT) Curtis Sites MD    Onset Date/Surgical Date 12/11/18    Next MD Visit 05/24/20      Precautions   Precautions None  Restrictions   Weight Bearing Restrictions No      Balance Screen   Has the patient fallen in the past 6 months Yes    How many times? 3    Has the patient had a decrease in activity level because of a fear of falling?  No    Is the patient reluctant to leave their home because of a fear of falling?  No      Prior Function   Level of Independence Independent      Cognition   Overall Cognitive Status Within Functional Limits for tasks assessed      Observation/Other Assessments   Observations Ambulates with SPC, limited knee flexion/extension ROM    Focus on Therapeutic Outcomes (FOTO)  n/a      Sensation   Light Touch Impaired Detail    Light Touch Impaired Details Impaired LLE    Additional Comments decreased L3-L5 on LLE      AROM   Overall AROM Comments pain with all arom worst with L side bending    Lumbar Flexion 0% limited    Lumbar Extension 0% limited    Lumbar - Right Side  Bend 25% limited    Lumbar - Left Side Bend 50% limited    Lumbar - Right Rotation 0% limited    Lumbar - Left Rotation 0% limited      Strength   Right Hip Flexion 4+/5    Left Hip Flexion 4/5    Right Knee Flexion 5/5    Right Knee Extension 5/5    Left Knee Flexion 4/5    Left Knee Extension 4/5    Right Ankle Dorsiflexion 5/5    Left Ankle Dorsiflexion 4/5      Ambulation/Gait   Ambulation/Gait Yes    Ambulation/Gait Assistance 6: Modified independent (Device/Increase time)    Ambulation Distance (Feet) 350 Feet    Assistive device Straight cane    Gait Pattern Left flexed knee in stance;Decreased hip/knee flexion - left    Ambulation Surface Level;Indoor    Gait velocity decreased    Gait Comments 2MWT, fatigued following                                 PT Education - 05/19/20 0920    Education Details HEP, exercise mechanics, progress made, returning to physical therapy if needed    Person(s) Educated Patient    Methods Explanation;Demonstration    Comprehension Verbalized understanding;Returned demonstration            PT Short Term Goals - 05/19/20 0940      PT SHORT TERM GOAL #1   Title Patient will be independent with HEP in order to improve functional outcomes.    Time 2    Period Weeks    Status Achieved    Target Date 05/09/20      PT SHORT TERM GOAL #2   Title Patient will report at least 25% improvement in symptoms for improved quality of life.    Time 2    Period Weeks    Status Not Met    Target Date 05/09/20             PT Long Term Goals - 05/19/20 0941      PT LONG TERM GOAL #1   Title Patient will report at least 75% improvement in symptoms for improved quality of life.    Time 4  Period Weeks    Status Not Met      PT LONG TERM GOAL #2   Title Patient will be able to ambulate at least 425 feet in 2MWT in order to demonstrate improved gait speed for community ambulation.    Time 4    Period Weeks     Status Not Met      PT LONG TERM GOAL #3   Title Patient will demonstrate at least 25% improvement in lumbar ROM in all restricted planes for improved ability to move trunk while completing chores.    Time 4    Period Weeks    Status Not Met                 Plan - 05/19/20 0919    Clinical Impression Statement Patient has met 1/2 short term goals and 0/3 long term goals with ability to complete HEP. Remaining goals not met due to continued symptoms and impaired ROM and gait. Patient continues to have severe radicular symptoms impairing strength and sensation in LLE. Patient has made minimal progress in improving strength in LLE but overall has not made much progress with PT intervention. Patient discharged from skilled physical therapy at this time.    Personal Factors and Comorbidities Fitness;Past/Current Experience;Behavior Pattern;Education;Time since onset of injury/illness/exacerbation;Social Background;Profession;Comorbidity 3+    Comorbidities chronic pain/ chronic back pain, L knee injuries, depression, anxiety    Examination-Activity Limitations Locomotion Level;Transfers;Bed Mobility;Bend;Lift;Stairs;Squat    Examination-Participation Restrictions Cleaning;Meal Prep;Occupation;Community Activity;Volunteer;Shop;Yard Work;Laundry    Stability/Clinical Decision Making Evolving/Moderate complexity    Rehab Potential Fair    PT Frequency --    PT Duration --    PT Treatment/Interventions ADLs/Self Care Home Management;Aquatic Therapy;Electrical Stimulation;Cryotherapy;Iontophoresis 50m/ml Dexamethasone;Moist Heat;Traction;Ultrasound;DME Instruction;Gait training;Stair training;Functional mobility training;Therapeutic activities;Therapeutic exercise;Balance training;Neuromuscular re-education;Patient/family education;Orthotic Fit/Training;Manual techniques;Scar mobilization;Passive range of motion;Dry needling;Energy conservation;Splinting;Taping;Spinal Manipulations;Joint  Manipulations    PT Next Visit Plan n/a    PT Home Exercise Plan 2/14 Press up. SItting with lumbar support    Consulted and Agree with Plan of Care Patient           Patient will benefit from skilled therapeutic intervention in order to improve the following deficits and impairments:  Abnormal gait,Difficulty walking,Decreased range of motion,Decreased endurance,Increased muscle spasms,Decreased activity tolerance,Pain,Impaired perceived functional ability,Decreased balance,Hypomobility,Improper body mechanics,Impaired flexibility,Decreased mobility,Decreased strength,Postural dysfunction  Visit Diagnosis: Low back pain, unspecified back pain laterality, unspecified chronicity, unspecified whether sciatica present  Muscle weakness (generalized)  Other abnormalities of gait and mobility  Other symptoms and signs involving the musculoskeletal system  Left knee pain, unspecified chronicity     Problem List Patient Active Problem List   Diagnosis Date Noted  . Colorectal polyps   . GERD (gastroesophageal reflux disease) 02/11/2019  . Colon cancer screening 02/11/2019    10:03 AM, 05/19/20 AMearl LatinPT, DPT [place] [address]Phone: [phone]  Fax:  [phone] Name: RNieves BarberiMRN: [id number] of Birth: [birth year]

## 2020-05-23 ENCOUNTER — Encounter (HOSPITAL_COMMUNITY): Payer: Medicaid Other

## 2020-05-25 ENCOUNTER — Encounter (HOSPITAL_COMMUNITY): Payer: Medicaid Other | Admitting: Physical Therapy

## 2020-09-26 ENCOUNTER — Encounter (HOSPITAL_COMMUNITY): Payer: Self-pay | Admitting: *Deleted

## 2020-09-26 ENCOUNTER — Emergency Department (HOSPITAL_COMMUNITY)
Admission: EM | Admit: 2020-09-26 | Discharge: 2020-09-26 | Disposition: A | Discharge status: Home / Self Care | Payer: Medicaid Other | Attending: Emergency Medicine | Admitting: Emergency Medicine

## 2020-09-26 ENCOUNTER — Other Ambulatory Visit: Payer: Self-pay

## 2020-09-26 DIAGNOSIS — Z87891 Personal history of nicotine dependence: Secondary | ICD-10-CM | POA: Insufficient documentation

## 2020-09-26 DIAGNOSIS — Z79899 Other long term (current) drug therapy: Secondary | ICD-10-CM | POA: Insufficient documentation

## 2020-09-26 DIAGNOSIS — Z85828 Personal history of other malignant neoplasm of skin: Secondary | ICD-10-CM | POA: Insufficient documentation

## 2020-09-26 DIAGNOSIS — R600 Localized edema: Secondary | ICD-10-CM | POA: Insufficient documentation

## 2020-09-26 DIAGNOSIS — M7989 Other specified soft tissue disorders: Secondary | ICD-10-CM | POA: Diagnosis present

## 2020-09-26 DIAGNOSIS — I10 Essential (primary) hypertension: Secondary | ICD-10-CM | POA: Diagnosis not present

## 2020-09-26 LAB — CBC WITH DIFFERENTIAL/PLATELET
Abs Immature Granulocytes: 0.06 10*3/uL (ref 0.00–0.07)
Basophils Absolute: 0.1 10*3/uL (ref 0.0–0.1)
Basophils Relative: 1 %
Eosinophils Absolute: 0.4 10*3/uL (ref 0.0–0.5)
Eosinophils Relative: 5 %
HCT: 42.2 % (ref 39.0–52.0)
Hemoglobin: 13.9 g/dL (ref 13.0–17.0)
Immature Granulocytes: 1 %
Lymphocytes Relative: 19 %
Lymphs Abs: 1.5 10*3/uL (ref 0.7–4.0)
MCH: 29.1 pg (ref 26.0–34.0)
MCHC: 32.9 g/dL (ref 30.0–36.0)
MCV: 88.5 fL (ref 80.0–100.0)
Monocytes Absolute: 1.3 10*3/uL — ABNORMAL HIGH (ref 0.1–1.0)
Monocytes Relative: 16 %
Neutro Abs: 4.7 10*3/uL (ref 1.7–7.7)
Neutrophils Relative %: 58 %
Platelets: 276 10*3/uL (ref 150–400)
RBC: 4.77 MIL/uL (ref 4.22–5.81)
RDW: 12.5 % (ref 11.5–15.5)
WBC: 8 10*3/uL (ref 4.0–10.5)
nRBC: 0 % (ref 0.0–0.2)

## 2020-09-26 LAB — BASIC METABOLIC PANEL
Anion gap: 7 (ref 5–15)
BUN: 11 mg/dL (ref 6–20)
CO2: 28 mmol/L (ref 22–32)
Calcium: 9 mg/dL (ref 8.9–10.3)
Chloride: 102 mmol/L (ref 98–111)
Creatinine, Ser: 0.95 mg/dL (ref 0.61–1.24)
GFR, Estimated: >60 mL/min (ref >60)
Glucose, Bld: 88 mg/dL (ref 70–99)
Potassium: 4.1 mmol/L (ref 3.5–5.1)
Sodium: 137 mmol/L (ref 135–145)

## 2020-09-26 MED ORDER — HYDROCHLOROTHIAZIDE 25 MG PO TABS
25.0000 mg | ORAL_TABLET | Freq: Every day | ORAL | 0 refills | Status: AC
Start: 2020-09-26 — End: 2020-10-01

## 2020-09-26 MED ORDER — KETOROLAC TROMETHAMINE 30 MG/ML IJ SOLN
15.0000 mg | Freq: Once | INTRAMUSCULAR | Status: AC
  Administered 2020-09-26: 15 mg via INTRAMUSCULAR
  Filled 2020-09-26: qty 1

## 2020-09-26 NOTE — ED Provider Notes (Signed)
[facility] Provider Note   CSN: [id number] Arrival date & time: 09/26/20  1119     History Chief Complaint  Patient presents with   Leg Pain    Jesus Graham is a 55 y.o. male.  55 year old male with history of hypertension, sciatica, additional history as listed below presents with complaint of swelling in both of his legs.  Patient states that he is scheduled for surgery at [facility] for his sciatica, has been sleeping in a recliner and less active than usual.  He denies any injuries to the legs, chest pain, shortness of breath, reports compliance with his blood pressure medications.  Reports this to be a recurrent problem, does not wear compression hose.  No other complaints or concerns.      Past Medical History:  Diagnosis Date   Anxiety    Back pain    Cancer (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 1995   HTN (hypertension)    PTSD (post-traumatic stress disorder)     Patient Active Problem List   Diagnosis Date Noted   Colorectal polyps    GERD (gastroesophageal reflux disease) 02/11/2019   Colon cancer screening 02/11/2019    Past Surgical History:  Procedure Laterality Date   COLONOSCOPY WITH PROPOFOL N/A 05/26/2019   Procedure: COLONOSCOPY WITH PROPOFOL;  Surgeon: Danie Binder, MD;  [facility];  Service: Endoscopy;  Laterality: N/A;  8:45am   left knee surgery     x 2   POLYPECTOMY  05/26/2019   Procedure: POLYPECTOMY;  Surgeon: Danie Binder, MD;  [facility];  Service: Endoscopy;;   VASECTOMY         Family History  Problem Relation Age of Onset   Alcohol abuse Father    Colon cancer Neg Hx     Social History   Tobacco Use   Smoking status: Former    Types: Cigarettes    Quit date: 03/13/1999    Years since quitting: 21.5   Smokeless tobacco: Current   Tobacco comments:    Dips occasionally  Vaping Use   Vaping Use: Never used   Substance Use Topics   Alcohol use: Not Currently   Drug use: Never    Home Medications Prior to Admission medications   Medication Sig Start Date End Date Taking? Authorizing Provider  hydrochlorothiazide (HYDRODIURIL) 25 MG tablet Take 1 tablet (25 mg total) by mouth daily for 5 days. 09/26/20 10/01/20 Yes Tacy Learn, PA-C  amLODipine (NORVASC) 10 MG tablet Take 10 mg by mouth daily.    [provider]  diazepam (VALIUM) 2 MG tablet Take 4 mg by mouth daily in the afternoon.    [provider]  diazepam (VALIUM) 5 MG tablet Take 10 mg by mouth in the morning and at bedtime.    [provider]  DULoxetine (CYMBALTA) 60 MG capsule Take 60 mg by mouth daily.    [provider]  gabapentin (NEURONTIN) 300 MG capsule Take 300 mg by mouth 3 (three) times daily.     [provider]  HYDROcodone-acetaminophen (NORCO) 10-325 MG tablet Take 1 tablet by mouth every 6 (six) hours as needed (pain.).    [provider]  risperiDONE (RISPERDAL) 0.5 MG tablet Take 1 tablet (0.5 mg total) by mouth at bedtime. 07/18/19   Norman Clay, MD  Tolnaftate (ABSORBINE JR  EX) Apply 1 application topically 4 (four) times daily as needed (pain.).    [provider]    Allergies    Celexa [citalopram], Codeine, and Nsaids  Review of Systems   Review of Systems  Constitutional:  Negative for fever.  Respiratory:  Negative for shortness of breath.   Cardiovascular:  Negative for chest pain.  Musculoskeletal:  Positive for arthralgias and myalgias.  Skin:  Negative for color change, rash and wound.  Allergic/Immunologic: Negative for immunocompromised state.  Neurological:  Negative for weakness and numbness.  All other systems reviewed and are negative.  Physical Exam Updated Vital Signs BP (!) 142/79   Pulse 64   Temp 98.4 F (36.9 C) (Oral)   Resp 18   Ht 6' (1.829 m)   Wt 91.2 kg   SpO2 99%   BMI 27.26 kg/m   Physical Exam Vitals  and nursing note reviewed.  Constitutional:      General: He is not in acute distress.    Appearance: He is well-developed. He is not diaphoretic.  HENT:     Head: Normocephalic and atraumatic.  Cardiovascular:     Rate and Rhythm: Normal rate and regular rhythm.     Heart sounds: Normal heart sounds.  Pulmonary:     Effort: Pulmonary effort is normal.     Breath sounds: Normal breath sounds.  Musculoskeletal:     Right lower leg: Edema present.     Left lower leg: Edema present.     Comments: Mild pitting edema to just below the knees bilaterally.  Skin:    General: Skin is warm and dry.  Neurological:     Mental Status: He is alert and oriented to person, place, and time.     Motor: No weakness.  Psychiatric:        Behavior: Behavior normal.    ED Results / Procedures / Treatments   Labs (all labs ordered are listed, but only abnormal results are displayed) Labs Reviewed  CBC WITH DIFFERENTIAL/PLATELET - Abnormal; Notable for the following components:      Result Value   Monocytes Absolute 1.3 (*)    All other components within normal limits  BASIC METABOLIC PANEL    EKG None  Radiology No results found.  Procedures Procedures   Medications Ordered in ED Medications  ketorolac (TORADOL) 30 MG/ML injection 15 mg (15 mg Intramuscular Given 09/26/20 1303)    ED Course  I have reviewed the triage vital signs and the nursing notes.  Pertinent labs & imaging results that were available during my care of the patient were reviewed by me and considered in my medical decision making (see chart for details).  Clinical Course as of 09/26/20 1351  Mon Sep 27, 3231  2792 55 year old male with complaint of edema to bilateral legs with worsening sciatica.  Patient is followed at [facility] for this, states he is scheduled for surgery. On exam, does have mild pitting edema to the lower legs extending to just below the knees. He is mildly hypertensive with a blood pressure of  142/79, does take Norvasc. Labs were assessed and is found to have normal renal function, CBC and BMP are unremarkable.  Patient was given Toradol for his pain.  Discharged with prescription for HCTZ, advised to elevate the legs and wear compression hose. In regards to his pain concerns, he just filled a prescription for 10 mg Percocet, advised to follow-up with his prescriber for further management. [LM]    Clinical Course User  Index [LM] Roque Lias   MDM Rules/Calculators/A&P                          Final Clinical Impression(s) / ED Diagnoses Final diagnoses:  Edema of both legs    Rx / DC Orders ED Discharge Orders          Ordered    hydrochlorothiazide (HYDRODIURIL) 25 MG tablet  Daily        09/26/20 1348             Roque Lias 09/26/20 1352    Milton Ferguson, MD 09/29/20 1004

## 2020-09-26 NOTE — ED Notes (Signed)
Pt with c/o of bilateral lower leg swelling, hx of the same and pt not able to state why.  Pt went to [facility] and left without being seen.

## 2020-09-26 NOTE — ED Notes (Signed)
Pt left without receiving his discharge papers.

## 2020-09-26 NOTE — ED Notes (Signed)
ED Provider at bedside. 

## 2020-09-26 NOTE — Discharge Instructions (Addendum)
Take HCTZ as prescribed. Elevate legs and apply compression hose. Recheck with your doctor.

## 2020-09-26 NOTE — ED Triage Notes (Signed)
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

## 2020-09-26 NOTE — ED Notes (Signed)
Pt ambulated to [facility] with cane and without difficultly.

## 2020-10-31 ENCOUNTER — Emergency Department: Payer: Medicaid Other

## 2020-10-31 ENCOUNTER — Encounter: Payer: Self-pay | Admitting: Emergency Medicine

## 2020-10-31 ENCOUNTER — Emergency Department
Admission: EM | Admit: 2020-10-31 | Discharge: 2020-10-31 | Disposition: A | Discharge status: Home / Self Care | Payer: Medicaid Other | Attending: Student in an Organized Health Care Education/Training Program | Admitting: Student in an Organized Health Care Education/Training Program

## 2020-10-31 ENCOUNTER — Other Ambulatory Visit: Payer: Self-pay

## 2020-10-31 DIAGNOSIS — I10 Essential (primary) hypertension: Secondary | ICD-10-CM | POA: Diagnosis not present

## 2020-10-31 DIAGNOSIS — R918 Other nonspecific abnormal finding of lung field: Secondary | ICD-10-CM | POA: Diagnosis not present

## 2020-10-31 DIAGNOSIS — Z87891 Personal history of nicotine dependence: Secondary | ICD-10-CM | POA: Insufficient documentation

## 2020-10-31 DIAGNOSIS — Z79899 Other long term (current) drug therapy: Secondary | ICD-10-CM | POA: Insufficient documentation

## 2020-10-31 DIAGNOSIS — Z85828 Personal history of other malignant neoplasm of skin: Secondary | ICD-10-CM | POA: Diagnosis not present

## 2020-10-31 DIAGNOSIS — M5440 Lumbago with sciatica, unspecified side: Secondary | ICD-10-CM | POA: Diagnosis not present

## 2020-10-31 DIAGNOSIS — R600 Localized edema: Secondary | ICD-10-CM | POA: Diagnosis not present

## 2020-10-31 DIAGNOSIS — M545 Low back pain, unspecified: Secondary | ICD-10-CM | POA: Diagnosis present

## 2020-10-31 DIAGNOSIS — G8929 Other chronic pain: Secondary | ICD-10-CM | POA: Insufficient documentation

## 2020-10-31 LAB — COMPREHENSIVE METABOLIC PANEL
ALT: 32 U/L (ref 0–44)
AST: 26 U/L (ref 15–41)
Albumin: 4 g/dL (ref 3.5–5.0)
Alkaline Phosphatase: 117 U/L (ref 38–126)
Anion gap: 7 (ref 5–15)
BUN: 13 mg/dL (ref 6–20)
CO2: 28 mmol/L (ref 22–32)
Calcium: 9.1 mg/dL (ref 8.9–10.3)
Chloride: 102 mmol/L (ref 98–111)
Creatinine, Ser: 1 mg/dL (ref 0.61–1.24)
GFR, Estimated: >60 mL/min (ref >60)
Glucose, Bld: 105 mg/dL — ABNORMAL HIGH (ref 70–99)
Potassium: 3.6 mmol/L (ref 3.5–5.1)
Sodium: 137 mmol/L (ref 135–145)
Total Bilirubin: 0.6 mg/dL (ref 0.3–1.2)
Total Protein: 6.8 g/dL (ref 6.5–8.1)

## 2020-10-31 LAB — URINALYSIS, COMPLETE (UACMP) WITH MICROSCOPIC
Bacteria, UA: NONE SEEN
Bilirubin Urine: NEGATIVE
Glucose, UA: NEGATIVE mg/dL
Hgb urine dipstick: NEGATIVE
Ketones, ur: NEGATIVE mg/dL
Leukocytes,Ua: NEGATIVE
Nitrite: NEGATIVE
Protein, ur: NEGATIVE mg/dL
RBC / HPF: NONE SEEN RBC/hpf (ref 0–5)
Specific Gravity, Urine: 1.025 (ref 1.005–1.030)
Squamous Epithelial / HPF: NONE SEEN (ref 0–5)
WBC, UA: NONE SEEN WBC/hpf (ref 0–5)
pH: 6 (ref 5.0–8.0)

## 2020-10-31 LAB — CBC WITH DIFFERENTIAL/PLATELET
Abs Immature Granulocytes: 0.14 10*3/uL — ABNORMAL HIGH (ref 0.00–0.07)
Basophils Absolute: 0.1 10*3/uL (ref 0.0–0.1)
Basophils Relative: 1 %
Eosinophils Absolute: 0.6 10*3/uL — ABNORMAL HIGH (ref 0.0–0.5)
Eosinophils Relative: 5 %
HCT: 41 % (ref 39.0–52.0)
Hemoglobin: 14.3 g/dL (ref 13.0–17.0)
Immature Granulocytes: 1 %
Lymphocytes Relative: 17 %
Lymphs Abs: 1.8 10*3/uL (ref 0.7–4.0)
MCH: 29 pg (ref 26.0–34.0)
MCHC: 34.9 g/dL (ref 30.0–36.0)
MCV: 83.2 fL (ref 80.0–100.0)
Monocytes Absolute: 1.8 10*3/uL — ABNORMAL HIGH (ref 0.1–1.0)
Monocytes Relative: 17 %
Neutro Abs: 6.4 10*3/uL (ref 1.7–7.7)
Neutrophils Relative %: 59 %
Platelets: 289 10*3/uL (ref 150–400)
RBC: 4.93 MIL/uL (ref 4.22–5.81)
RDW: 12.8 % (ref 11.5–15.5)
WBC: 10.9 10*3/uL — ABNORMAL HIGH (ref 4.0–10.5)
nRBC: 0 % (ref 0.0–0.2)

## 2020-10-31 LAB — BRAIN NATRIURETIC PEPTIDE: B Natriuretic Peptide: 12.1 pg/mL (ref 0.0–100.0)

## 2020-10-31 LAB — TROPONIN I (HIGH SENSITIVITY): Troponin I (High Sensitivity): 7 ng/L (ref <18)

## 2020-10-31 MED ORDER — FENTANYL CITRATE (PF) 100 MCG/2ML IJ SOLN
50.0000 ug | Freq: Once | INTRAMUSCULAR | Status: AC
  Administered 2020-10-31: 50 ug via INTRAVENOUS
  Filled 2020-10-31: qty 2

## 2020-10-31 MED ORDER — ONDANSETRON HCL 4 MG/2ML IJ SOLN
4.0000 mg | Freq: Once | INTRAMUSCULAR | Status: AC
  Administered 2020-10-31: 4 mg via INTRAVENOUS
  Filled 2020-10-31: qty 2

## 2020-10-31 MED ORDER — IOHEXOL 350 MG/ML SOLN
75.0000 mL | Freq: Once | INTRAVENOUS | Status: AC | PRN
  Administered 2020-10-31: 75 mL via INTRAVENOUS
  Filled 2020-10-31: qty 75

## 2020-10-31 NOTE — ED Triage Notes (Signed)
Presents with lower back pain and bilateral swelling noted to both legs  states pain and swelling is worse on left  hx of sciatica   states pain is worse this am

## 2020-10-31 NOTE — ED Provider Notes (Signed)
[facility] Emergency Department Provider Note  ____________________________________________   Event Date/Time   First MD Initiated Contact with Patient 10/31/20 [phone]     (approximate)  I have reviewed the triage vital signs and the nursing notes.   HISTORY  Chief Complaint Back Pain (/)    HPI Jesus Graham is a 55 y.o. male presents emergency department complaint bilateral lower extremity pain and swelling.  Patient is also complaining of low back pain that radiates to the left leg.  States that his doctor gave him HCTZ for the fluid.  He denies chest pain.  States he sleeps upright and does not know if he has difficulty breathing when lying flat.  States he has been unable to sleep for the last few days.  States his feet do not fit in his shoes due to the amount of swelling.  Denies fever or chills.  No new back injury.  No pain in the calves.  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   HTN (hypertension)    PTSD (post-traumatic stress disorder)     Patient Active Problem List   Diagnosis Date Noted   Colorectal polyps    GERD (gastroesophageal reflux disease) 02/11/2019   Colon cancer screening 02/11/2019    Past Surgical History:  Procedure Laterality Date   COLONOSCOPY WITH PROPOFOL N/A 05/26/2019   Procedure: COLONOSCOPY WITH PROPOFOL;  Surgeon: Danie Binder, MD;  [facility];  Service: Endoscopy;  Laterality: N/A;  8:45am   left knee surgery     x 2   POLYPECTOMY  05/26/2019   Procedure: POLYPECTOMY;  Surgeon: Danie Binder, MD;  Location: [facility];  Service: Endoscopy;;   VASECTOMY      Prior to Admission medications   Medication Sig Start Date End Date Taking? Authorizing Provider  oxyCODONE-acetaminophen (PERCOCET/ROXICET) 5-325 MG tablet Take by mouth every 4 (four)  hours as needed for severe pain.   Yes [provider]  amLODipine (NORVASC) 10 MG tablet Take 10 mg by mouth daily.    [provider]  diazepam (VALIUM) 2 MG tablet Take 4 mg by mouth daily in the afternoon.    [provider]  diazepam (VALIUM) 5 MG tablet Take 10 mg by mouth in the morning and at bedtime.    [provider]  DULoxetine (CYMBALTA) 60 MG capsule Take 60 mg by mouth daily.    [provider]  gabapentin (NEURONTIN) 300 MG capsule Take 300 mg by mouth 3 (three) times daily.     [provider]  hydrochlorothiazide (HYDRODIURIL) 25 MG tablet Take 1 tablet (25 mg total) by mouth daily for 5 days. 09/26/20 10/01/20  Tacy Learn, PA-C  risperiDONE (RISPERDAL) 0.5 MG tablet Take 1 tablet (0.5 mg total) by mouth at bedtime. 07/18/19   Norman Clay, MD  Tolnaftate (ABSORBINE JR EX) Apply 1 application topically 4 (four) times daily as needed (pain.).    [provider]    Allergies Celexa [citalopram], Codeine, and Nsaids  Family History  Problem Relation Age of Onset   Alcohol abuse Father    Colon cancer Neg Hx     Social History Social History   Tobacco Use   Smoking status: Former  Types: Cigarettes    Quit date: 03/13/1999    Years since quitting: 21.6   Smokeless tobacco: Current   Tobacco comments:    Dips occasionally  Vaping Use   Vaping Use: Never used  Substance Use Topics   Alcohol use: Not Currently   Drug use: Never    Review of Systems  Constitutional: No fever/chills Eyes: No visual changes. ENT: No sore throat. Respiratory: Denies cough Cardiovascular: Denies chest pain Gastrointestinal: Denies abdominal pain Genitourinary: Negative for dysuria. Musculoskeletal: Positive for back pain. Skin: Negative for rash. Psychiatric: no mood changes,     ____________________________________________   PHYSICAL EXAM:  VITAL SIGNS: ED Triage Vitals  Enc Vitals Group     BP  10/31/20 0932 (!) 168/84     Pulse Rate 10/31/20 0932 87     Resp 10/31/20 0932 19     Temp 10/31/20 0932 97.6 F (36.4 C)     Temp Source 10/31/20 0932 Oral     SpO2 10/31/20 0932 99 %     Weight 10/31/20 0944 220 lb (99.8 kg)     Height 10/31/20 0944 6' (1.829 m)     Head Circumference --      Peak Flow --      Pain Score 10/31/20 0944 9     Pain Loc --      Pain Edu? --      Excl. in [place]? --     Constitutional: Alert and oriented. Well appearing and in no acute distress. Eyes: Conjunctivae are normal.  Head: Atraumatic. Nose: No congestion/rhinnorhea. Mouth/Throat: Mucous membranes are moist.   Neck:  supple no lymphadenopathy noted Cardiovascular: Normal rate, regular rhythm. Heart sounds are normal Respiratory: Normal respiratory effort.  No retractions, lungs c t a  Abd: soft nontender bs normal all 4 quad GU: deferred Musculoskeletal: FROM all extremities, warm and well perfused, 1+ pitting edema noted bilaterally Neurologic:  Normal speech and language.  Skin:  Skin is warm, dry and intact. No rash noted. Psychiatric: Mood and affect are normal. Speech and behavior are normal.  ____________________________________________   LABS (all labs ordered are listed, but only abnormal results are displayed)  Labs Reviewed  COMPREHENSIVE METABOLIC PANEL - Abnormal; Notable for the following components:      Result Value   Glucose, Bld 105 (*)    All other components within normal limits  CBC WITH DIFFERENTIAL/PLATELET - Abnormal; Notable for the following components:   WBC 10.9 (*)    Monocytes Absolute 1.8 (*)    Eosinophils Absolute 0.6 (*)    Abs Immature Granulocytes 0.14 (*)    All other components within normal limits  BRAIN NATRIURETIC PEPTIDE  URINALYSIS, COMPLETE (UACMP) WITH MICROSCOPIC  TROPONIN I (HIGH SENSITIVITY)   ____________________________________________   ____________________________________________  RADIOLOGY  Chest  x-ray  ____________________________________________   PROCEDURES  Procedure(s) performed: No  Procedures    ____________________________________________   INITIAL IMPRESSION / ASSESSMENT AND PLAN / ED COURSE  Pertinent labs & imaging results that were available during my care of the patient were reviewed by me and considered in my medical decision making (see chart for details).   Patient is 55 year old male presents with lower extremity swelling and back pain.  See HPI.  Physical exam shows patient be stable at this time  DDx: Dependent edema, CHF, DVT, chronic back pain  Labs are reassuring, CBC, metabolic panel, BMP, troponin and urinalysis are all normal  Chest x-ray shows concerns for nodule.  Reviewed by me confirmed by  radiology  CT of the chest shows groundglass opacity which could indicate inflammation or infection.  Patient states he has not had a cough or congestion.  Does not feel that he is having any infection.  Unsure if he has ever had COVID.  He states he is feeling better after the pain medication.  Patient will be discharged and is to follow-up with neurosurgery.  If they cannot help him they could send him to the pain clinic.  He agrees with treatment plan.  He was discharged stable condition in the care of her friend.     Jesus Graham was evaluated in Emergency Department on 10/31/2020 for the symptoms described in the history of present illness. He was evaluated in the context of the global COVID-19 pandemic, which necessitated consideration that the patient might be at risk for infection with the SARS-CoV-2 virus that causes COVID-19. Institutional protocols and algorithms that pertain to the evaluation of patients at risk for COVID-19 are in a state of rapid change based on information released by regulatory bodies including the CDC and federal and state organizations. These policies and algorithms were followed during the patient's care in the ED.    As  part of my medical decision making, I reviewed the following data within the [facility] notes reviewed and incorporated, Labs reviewed , EKG interpreted NSR, Old chart reviewed, Radiograph reviewed , Notes from prior ED visits, and [place] Controlled Substance Database  ____________________________________________   FINAL CLINICAL IMPRESSION(S) / ED DIAGNOSES  Final diagnoses:  Chronic low back pain with sciatica, sciatica laterality unspecified, unspecified back pain laterality  Edema of both lower extremities      NEW MEDICATIONS STARTED DURING THIS VISIT:  Discharge Medication List as of 10/31/2020  1:25 PM       Note:  This document was prepared using Dragon voice recognition software and may include unintentional dictation errors.    Versie Starks, PA-C 10/31/20 1537    Merlyn Lot, MD 11/01/20 Vernelle Emer[facility]

## 2020-10-31 NOTE — Discharge Instructions (Addendum)
Continue to take all of your regular medications.  Return emergency department worsening.  Follow-up with [facility] clinic neurosurgery.

## 2021-01-26 ENCOUNTER — Other Ambulatory Visit (HOSPITAL_COMMUNITY): Payer: Self-pay | Admitting: Family Medicine

## 2021-01-26 DIAGNOSIS — Z87891 Personal history of nicotine dependence: Secondary | ICD-10-CM

## 2021-01-26 DIAGNOSIS — R634 Abnormal weight loss: Secondary | ICD-10-CM

## 2021-01-30 ENCOUNTER — Other Ambulatory Visit: Payer: Self-pay | Admitting: Family Medicine

## 2021-01-30 ENCOUNTER — Other Ambulatory Visit (HOSPITAL_COMMUNITY): Payer: Self-pay | Admitting: Family Medicine

## 2021-01-30 DIAGNOSIS — E041 Nontoxic single thyroid nodule: Secondary | ICD-10-CM

## 2021-02-06 ENCOUNTER — Ambulatory Visit (HOSPITAL_COMMUNITY): Admission: RE | Admit: 2021-02-06 | Payer: Medicaid Other | Source: Ambulatory Visit

## 2021-02-06 ENCOUNTER — Encounter (HOSPITAL_COMMUNITY): Payer: Self-pay

## 2021-02-09 ENCOUNTER — Ambulatory Visit (INDEPENDENT_AMBULATORY_CARE_PROVIDER_SITE_OTHER): Payer: Medicaid Other

## 2021-02-09 ENCOUNTER — Encounter: Payer: Self-pay | Admitting: Orthopaedic Surgery

## 2021-02-09 ENCOUNTER — Other Ambulatory Visit: Payer: Self-pay

## 2021-02-09 ENCOUNTER — Ambulatory Visit (INDEPENDENT_AMBULATORY_CARE_PROVIDER_SITE_OTHER): Payer: Medicaid Other | Admitting: Orthopaedic Surgery

## 2021-02-09 VITALS — Ht 72.0 in | Wt 206.0 lb

## 2021-02-09 DIAGNOSIS — G8929 Other chronic pain: Secondary | ICD-10-CM | POA: Diagnosis not present

## 2021-02-09 DIAGNOSIS — M545 Low back pain, unspecified: Secondary | ICD-10-CM | POA: Diagnosis not present

## 2021-02-09 DIAGNOSIS — M48061 Spinal stenosis, lumbar region without neurogenic claudication: Secondary | ICD-10-CM | POA: Diagnosis not present

## 2021-02-09 NOTE — Progress Notes (Signed)
Office Visit Note   Patient: Jesus Graham           Date of Birth: 1965/12/27           MRN: [id number] Visit Date: 02/09/2021              Requested by: Alfonse Flavors, MD [address] [place],  [place] [address] PCP: Alfonse Flavors, MD   Assessment & Plan: Visit Diagnoses:  1. Chronic left-sided low back pain, unspecified whether sciatica present   2. Lumbar foraminal stenosis     Plan: Patient states he will need recommended Opana to him.  He states when he went to the area there were a lot of very thin malnourished looking people at the clinic.  We discussed with him he may have some component of hyperalgesia.  He is on larger dose of narcotic pain medication too high to consider lumbar procedures.  He could bring his disc biopsy wants me to review it and discuss it with him.  We reviewed the results which showed arthritic changes with foraminal stenosis worse at the L5-S1 level.  Patient been on narcotic medication for years.  Discussed with patient that postoperative pain is difficult to handle in patients who are on higher doses of narcotic medication.  Follow-Up Instructions: No follow-ups on file.   Orders:  Orders Placed This Encounter  Procedures   XR Lumbar Spine Complete   No orders of the defined types were placed in this encounter.     Procedures: No procedures performed   Clinical Data: No additional findings.   Subjective: Chief Complaint  Patient presents with   Lower Back - Pain    HPI 55 year old male lives in [place] here with a male neighbor road with him has complaints of low back pain which she states been present for 2 years.  He has had 2 epidurals which did not help.  He was seen in [place] and was told surgery was not indicated since he did not get better with the epidural injections.  Patient initially denied taking narcotic pain medication and states he does not like that medicine and how it makes him feel.  [place] PDMP reviewed which shows his numbers are 602, 563 for a total of 320.  Patient is on OxyContin 10 mg 2 a day and Percocet 10/325 120 tablets monthly.  He is also on Lyrica and clonazepam.  He takes zolpidem at night. Patient states he had a femur fracture in the past later had a tibia fracture due to an accidental fall.  Surgery on his femur and left tibia.  Patient states he has numbness and tingling and notes he is losing motion in his left foot and ankle and his left leg is weak.  Past history of anxiety depression hypertension osteoporosis sleep problems.  Previous problems with his knees with knee arthroscopies in the past.  Results of lumbar MRI scan done in [place] is listed below. Review of Systems Negative chills or fever no previous lumbar surgeries.  Objective: Vital Signs: Ht 6' (1.829 m)   Wt 206 lb (93.4 kg)   BMI 27.94 kg/m   Physical Exam  Ortho Exam  Specialty Comments:  No specialty comments available.  Imaging: IMPRESSION:  1. Moderate, moderate-severe, and severe bilateral foraminal stenosis is  demonstrated at L3-L4, L4-L5, and L5-S1, respectively. Left foraminal disc  protrusion exerting mass effect on the exiting L5 nerve root at L5-S1   2. Otherwise without evidence of  canal stenosis throughout the lumbar  spine.      Electronically Signed by:  Richardo Hanks, MD, [facility]  Electronically Signed on:  09/10/2020 12:04    PMFS History: Patient Active Problem List   Diagnosis Date Noted   Lumbar foraminal stenosis 02/09/2021   Colorectal polyps    GERD (gastroesophageal reflux disease) 02/11/2019   Colon cancer screening 02/11/2019   Past Medical History:  Diagnosis Date   Anxiety    Back pain    Cancer ([place])    skin cancer   Chronic pain of left knee    Depression    History of alcoholism ([place])    quit in 1995   HTN (hypertension)    PTSD (post-traumatic stress disorder)     Family History  Problem Relation Age of Onset    Alcohol abuse Father    Colon cancer Neg Hx     Past Surgical History:  Procedure Laterality Date   COLONOSCOPY WITH PROPOFOL N/A 05/26/2019   Procedure: COLONOSCOPY WITH PROPOFOL;  Surgeon: Danie Binder, MD;  [facility];  Service: Endoscopy;  Laterality: N/A;  8:45am   left knee surgery     x 2   POLYPECTOMY  05/26/2019   Procedure: POLYPECTOMY;  Surgeon: Danie Binder, MD;  [facility];  Service: Endoscopy;;   VASECTOMY     Social History   Occupational History   Not on file  Tobacco Use   Smoking status: Former    Types: Cigarettes    Quit date: 03/13/1999    Years since quitting: 21.9   Smokeless tobacco: Current   Tobacco comments:    Dips occasionally  Vaping Use   Vaping Use: Never used  Substance and Sexual Activity   Alcohol use: Not Currently   Drug use: Never   Sexual activity: Not on file

## 2021-02-10 ENCOUNTER — Ambulatory Visit: Payer: Medicaid Other | Admitting: Nurse Practitioner

## 2021-02-15 ENCOUNTER — Ambulatory Visit (HOSPITAL_COMMUNITY)
Admission: RE | Admit: 2021-02-15 | Discharge: 2021-02-15 | Disposition: A | Discharge status: Home / Self Care | Payer: Medicaid Other | Source: Ambulatory Visit | Attending: Family Medicine | Admitting: Family Medicine

## 2021-02-15 ENCOUNTER — Other Ambulatory Visit: Payer: Self-pay

## 2021-02-15 DIAGNOSIS — E041 Nontoxic single thyroid nodule: Secondary | ICD-10-CM | POA: Insufficient documentation

## 2021-02-22 ENCOUNTER — Ambulatory Visit: Payer: Medicaid Other | Admitting: Nurse Practitioner

## 2021-03-07 ENCOUNTER — Ambulatory Visit (HOSPITAL_COMMUNITY)
Admission: RE | Admit: 2021-03-07 | Discharge: 2021-03-07 | Disposition: A | Discharge status: Home / Self Care | Payer: Medicaid Other | Source: Ambulatory Visit | Attending: Family Medicine | Admitting: Family Medicine

## 2021-03-07 ENCOUNTER — Other Ambulatory Visit: Payer: Self-pay

## 2021-03-07 DIAGNOSIS — R634 Abnormal weight loss: Secondary | ICD-10-CM | POA: Diagnosis present

## 2021-03-07 DIAGNOSIS — Z87891 Personal history of nicotine dependence: Secondary | ICD-10-CM | POA: Insufficient documentation

## 2021-04-18 ENCOUNTER — Telehealth: Payer: Self-pay | Admitting: Nurse Practitioner

## 2021-04-18 NOTE — Telephone Encounter (Signed)
Patient refused his referral for his thyroid. Closed referral and notified PCP via fax on 04/18/21

## 2021-04-28 ENCOUNTER — Telehealth: Payer: Self-pay | Admitting: Orthopedic Surgery

## 2021-04-28 NOTE — Telephone Encounter (Signed)
Patient called and wants to become a patient of ours.  He saw Dr. Lorin Mercy on 02/09/21 and in  his words Dr. Lorin Mercy only wanted to talk about liquir and did not do nothing for him and his problem.   He wants to become a patient of ours.  Advised patient since he wants to be seen for the same thing Dr. Lorin Mercy seen him for this would be a 2nd opinion and we need the notes and xrays for our doctor to review and see if this is something they can see him for.

## 2021-11-01 ENCOUNTER — Telehealth (HOSPITAL_COMMUNITY): Payer: Self-pay

## 2021-12-04 LAB — TSH: TSH: 0.01 — AB (ref 0.41–5.90)

## 2021-12-28 ENCOUNTER — Encounter: Payer: Self-pay | Admitting: Nurse Practitioner

## 2021-12-28 ENCOUNTER — Ambulatory Visit: Payer: Medicaid Other | Admitting: Nurse Practitioner

## 2021-12-28 VITALS — BP 120/74 | HR 67 | Ht 72.0 in | Wt 196.0 lb

## 2021-12-28 DIAGNOSIS — E041 Nontoxic single thyroid nodule: Secondary | ICD-10-CM | POA: Diagnosis not present

## 2021-12-28 DIAGNOSIS — E0590 Thyrotoxicosis, unspecified without thyrotoxic crisis or storm: Secondary | ICD-10-CM

## 2021-12-28 NOTE — Progress Notes (Signed)
12/28/2021     Endocrinology Consult Note    Subjective:    Patient ID: Jesus Graham, male    DOB: [birth year], PCP Zhou-Talbert, Elwyn Lade, MD.   Past Medical History:  Diagnosis Date   Anxiety    Back pain    Cancer ([place])    skin cancer   Chronic pain of left knee    Depression    History of alcoholism ([place])    quit in 1995   HTN (hypertension)    PTSD (post-traumatic stress disorder)     Past Surgical History:  Procedure Laterality Date   COLONOSCOPY WITH PROPOFOL N/A 05/26/2019   Procedure: COLONOSCOPY WITH PROPOFOL;  Surgeon: Danie Binder, MD;  Location: [facility];  Service: Endoscopy;  Laterality: N/A;  8:45am   left knee surgery     x 2   POLYPECTOMY  05/26/2019   Procedure: POLYPECTOMY;  Surgeon: Danie Binder, MD;  Location: [facility];  Service: Endoscopy;;   VASECTOMY      Social History   Socioeconomic History   Marital status: Legally Separated    Spouse name: Not on file   Number of children: Not on file   Years of education: Not on file   Highest education level: Not on file  Occupational History   Not on file  Tobacco Use   Smoking status: Former    Types: Cigarettes    Quit date: 03/13/1999    Years since quitting: 22.8   Smokeless tobacco: Current   Tobacco comments:    Dips occasionally  Vaping Use   Vaping Use: Never used  Substance and Sexual Activity   Alcohol use: Not Currently   Drug use: Never   Sexual activity: Not on file  Other Topics Concern   Not on file  Social History Narrative   Not on file   Social Determinants of Health   Financial Resource Strain: Not on file  Food Insecurity: Not on file  Transportation Needs: Not on file  Physical Activity: Not on file  Stress: Not on file  Social Connections: Not on file    Family History  Problem Relation Age of Onset   Alcohol abuse Father    Colon cancer Neg Hx     Outpatient Encounter Medications as of 12/28/2021  Medication Sig   amLODipine  (NORVASC) 10 MG tablet Take 10 mg by mouth daily.   oxyCODONE-acetaminophen (PERCOCET/ROXICET) 5-325 MG tablet Take by mouth every 4 (four) hours as needed for severe pain.   OXYCONTIN 10 MG 12 hr tablet Take 10 mg by mouth 2 (two) times daily.   pregabalin (LYRICA) 150 MG capsule Take 150 mg by mouth 2 (two) times daily.   rosuvastatin (CRESTOR) 20 MG tablet Take 20 mg by mouth daily.   zolpidem (AMBIEN) 10 MG tablet Take 10 mg by mouth at bedtime as needed.   hydrochlorothiazide (HYDRODIURIL) 25 MG tablet Take 1 tablet (25 mg total) by mouth daily for 5 days.   [DISCONTINUED] clonazePAM (KLONOPIN) 0.5 MG tablet Take by mouth.   [DISCONTINUED] diazepam (VALIUM) 2 MG tablet Take 4 mg by mouth daily in the afternoon. (Patient not taking: Reported on 02/09/2021)   [DISCONTINUED] diazepam (VALIUM) 5 MG tablet Take 10 mg by mouth in the morning and at bedtime. (Patient not taking: Reported on 02/09/2021)   [DISCONTINUED] DULoxetine (CYMBALTA) 60 MG capsule Take 60 mg by mouth daily. (Patient not taking: Reported on 02/09/2021)   [DISCONTINUED] gabapentin (NEURONTIN) 300 MG  capsule Take 300 mg by mouth 3 (three) times daily.  (Patient not taking: Reported on 02/09/2021)   [DISCONTINUED] risperiDONE (RISPERDAL) 0.5 MG tablet Take 1 tablet (0.5 mg total) by mouth at bedtime. (Patient not taking: Reported on 02/09/2021)   [DISCONTINUED] Tolnaftate (ABSORBINE JR EX) Apply 1 application topically 4 (four) times daily as needed (pain.). (Patient not taking: Reported on 02/09/2021)   No facility-administered encounter medications on file as of 12/28/2021.    ALLERGIES: Allergies  Allergen Reactions   Celexa [Citalopram] Other (See Comments)    Closes throat up   Codeine Nausea Only   Nsaids Nausea Only    VACCINATION STATUS:  There is no immunization history on file for this patient.   HPI  Jesus Graham is 56 y.o. male who presents today with a medical history as above. he is being seen in  consultation for hyperthyroidism requested by Zhou-Talbert, Elwyn Lade, MD.  he has been dealing with symptoms of anxiety, unexplained weight loss, palpitations, tremors, insomnia, and irritability on/off for 5 years. These symptoms are progressively worsening and troubling to him.  his most recent thyroid labs revealed suppressed TSH of < 0.01 and elevated Free T4 of 2.0 on 12/04/21.  he denies dysphagia, choking, shortness of breath, no recent voice change.    he denies any known family history of thyroid dysfunction and denies family hx of thyroid cancer. he denies personal history of goiter. he is not on any anti-thyroid medications nor on any thyroid hormone supplements. Denies use of Biotin containing supplements.  he is willing to proceed with appropriate work up and therapy for thyrotoxicosis.   Review of systems  Constitutional: + decreasing body weight, current Body mass index is 26.58 kg/m., no fatigue, no subjective hyperthermia, no subjective hypothermia, +insomnia Eyes: no blurry vision, no xerophthalmia ENT: no sore throat, no nodules palpated in throat, no dysphagia/odynophagia, no hoarseness Cardiovascular: no chest pain, no shortness of breath, + palpitations, no leg swelling Respiratory: no cough, no shortness of breath Gastrointestinal: no nausea/vomiting/diarrhea Musculoskeletal: no muscle/joint aches Skin: no rashes, no hyperemia Neurological: + tremors, no numbness, no tingling, no dizziness Psychiatric: no depression, + anxiety, + irritability   Objective:    BP 120/74 (BP Location: Left Arm, Patient Position: Sitting, Cuff Size: Large)   Pulse 67   Ht 6' (1.829 m)   Wt 196 lb (88.9 kg)   BMI 26.58 kg/m   Wt Readings from Last 3 Encounters:  12/28/21 196 lb (88.9 kg)  02/09/21 206 lb (93.4 kg)  10/31/20 220 lb (99.8 kg)     BP Readings from Last 3 Encounters:  12/28/21 120/74  10/31/20 129/72  09/26/20 (!) 142/79                         Physical Exam-  Limited  Constitutional:  Body mass index is 26.58 kg/m. , not in acute distress,anxious state of mind, avoids direct eye contact Eyes:  EOMI, no exophthalmos Neck: Supple Cardiovascular: RRR, no murmurs, rubs, or gallops, no edema Respiratory: Adequate breathing efforts, no crackles, rales, rhonchi, or wheezing Musculoskeletal: no gross deformities, strength intact in all four extremities, no gross restriction of joint movements Skin:  no rashes, no hyperemia Neurological: + tremor with outstretched hands, DTR normal in BLE   CMP     Component Value Date/Time   NA 137 10/31/2020 1048   K 3.6 10/31/2020 1048   CL 102 10/31/2020 1048   CO2 28 10/31/2020 1048  GLUCOSE 105 (H) 10/31/2020 1048   BUN 13 10/31/2020 1048   CREATININE 1.00 10/31/2020 1048   CALCIUM 9.1 10/31/2020 1048   PROT 6.8 10/31/2020 1048   ALBUMIN 4.0 10/31/2020 1048   AST 26 10/31/2020 1048   ALT 32 10/31/2020 1048   ALKPHOS 117 10/31/2020 1048   BILITOT 0.6 10/31/2020 1048   GFRNONAA >60 10/31/2020 1048   GFRAA >60 09/05/2019 1254     CBC    Component Value Date/Time   WBC 10.9 (H) 10/31/2020 1048   RBC 4.93 10/31/2020 1048   HGB 14.3 10/31/2020 1048   HCT 41.0 10/31/2020 1048   PLT 289 10/31/2020 1048   MCV 83.2 10/31/2020 1048   MCH 29.0 10/31/2020 1048   MCHC 34.9 10/31/2020 1048   RDW 12.8 10/31/2020 1048   LYMPHSABS 1.8 10/31/2020 1048   MONOABS 1.8 (H) 10/31/2020 1048   EOSABS 0.6 (H) 10/31/2020 1048   BASOSABS 0.1 10/31/2020 1048     Diabetic Labs (most recent): No results found for: "HGBA1C", "MICROALBUR"  Lipid Panel  No results found for: "CHOL", "TRIG", "HDL", "CHOLHDL", "VLDL", "LDLCALC", "LDLDIRECT", "LABVLDL"   Lab Results  Component Value Date   TSH 0.01 (A) 12/04/2021        Assessment & Plan:   1. Thyroid nodule 2.  Hyperthyroidism-unspecified  he is being seen at a kind request of Zhou-Talbert, Elwyn Lade, MD.  his history and most recent labs are reviewed,  and he was examined clinically. Subjective and objective findings are consistent with thyrotoxicosis likely from primary hyperthyroidism. The potential risks of untreated thyrotoxicosis and the need for definitive therapy have been discussed in detail with him, and he agrees to proceed with diagnostic workup and treatment plan.   I will repeat full profile thyroid function tests today (including antibody testing to assess for autoimmune thyroid dysfunction) and confirmatory thyroid uptake and scan will be scheduled to be done as soon as possible, if warranted.   Options of therapy are discussed with him.  We discussed the option of treating it with medications including methimazole or PTU which may have side effects including rash, transaminitis, and bone marrow suppression.  We also discussed the option of definitive therapy with RAI ablation of the thyroid. If he is found to have primary hyperthyroidism from Graves' disease , toxic multinodular goiter or toxic nodular goiter the preferred modality of treatment would be I-131 thyroid ablation. Surgery is another choice of treatment in some cases, in his case surgery is not a good fit for presentation with only mild goiter.  -Patient is made aware of the high likelihood of post ablative hypothyroidism with subsequent need for lifelong thyroid hormone replacement. he understands this outcome and he is willing to proceed with testing but is not thrilled about needing medication daily for the rest of his life.  he will return in 1 week for treatment decision.  I did not initiate any new prescriptions today.  His HR was controlled at 67.    -Patient is advised to maintain close follow up with Zhou-Talbert, Elwyn Lade, MD for primary care needs.   - Time spent with the patient: 60 minutes, of which >50% was spent in obtaining information about his symptoms, reviewing his previous labs, evaluations, and treatments, counseling him about his hyperthyroidism ,  and developing a plan to confirm the diagnosis and long term treatment as necessary. Please refer to "Patient Self Inventory" in the Media tab for reviewed elements of pertinent patient history.  Jesus Graham participated  in the discussions, expressed understanding, and voiced agreement with the above plans.  All questions were answered to his satisfaction. he is encouraged to contact clinic should he have any questions or concerns prior to his return visit.   Follow up plan: Return in about 1 week (around 01/04/2022) for Thyroid follow up, Previsit labs.   Thank you for involving me in the care of this pleasant patient, and I will continue to update you with his progress.    Rayetta Pigg, [facility] [address] [place], [place] [address] Phone: [phone] Fax: [phone]  12/28/2021, 11:26 AM

## 2021-12-28 NOTE — Patient Instructions (Addendum)
Hyperthyroidism Hyperthyroidism refers to a thyroid gland that is too active or overactive. The thyroid gland is a small gland located in the lower front part of the neck, just in front of the windpipe (trachea). This gland makes hormones that: Help control how the body uses food for energy (metabolism). Help the heart and brain work well. Keep your bones strong. When the thyroid is overactive, it produces too much of a hormone called thyroxine. What are the causes? This condition may be caused by: Graves' disease. This is a disorder in which the body's disease-fighting system (immune system) attacks the thyroid gland. This is the most common cause. Inflammation of the thyroid gland. A tumor in the thyroid gland. Use of certain medicines, including: Prescription thyroid hormone replacement. Herbal supplements that mimic thyroid hormones. Amiodarone therapy. Solid or fluid-filled lumps within your thyroid gland (thyroid nodules). Taking in a large amount of iodine from foods or medicines. What increases the risk? You are more likely to develop this condition if: You are male. You have a family history of thyroid conditions. You smoke tobacco. You use a medicine called lithium. You take medicines that affect the immune system (immunosuppressants). What are the signs or symptoms? Symptoms of this condition include: Nervousness. Inability to tolerate heat. Diarrhea. Rapid heart rate. Shaky hands. Restlessness. Sleep problems. Other symptoms may include: Heart skipping beats or making extra beats. Unexplained weight loss. Change in the texture of hair or skin. Loss of menstruation. Fatigue. Enlarged thyroid gland or a lump in the thyroid (nodule). You may also have symptoms of Graves' disease, which may include: Protruding eyes. Dry eyes. Red or swollen eyes. Problems with vision. How is this diagnosed? This condition may be diagnosed based on: Your symptoms and medical  history. A physical exam. Blood tests. Thyroid ultrasound. This test involves using sound waves to produce images of the thyroid gland. A thyroid scan. A radioactive substance is injected into a vein, and images show how much iodine is present in the thyroid. Radioactive iodine uptake test (RAIU). A small amount of radioactive iodine is given by mouth to see how much iodine the thyroid absorbs after a certain amount of time. How is this treated? Treatment depends on the cause and severity of the condition. Treatment may include: Medicines to reduce the amount of thyroid hormone your body makes. Medicines to help manage your symptoms. Radioactive iodine treatment (radioiodine therapy). This involves swallowing a small dose of radioactive iodine, in capsule or liquid form, to kill thyroid cells. Surgery to remove part or all of your thyroid gland. You may need to take thyroid hormone replacement medicine for the rest of your life after thyroid surgery. Follow these instructions at home:  Take over-the-counter and prescription medicines only as told by your health care provider. Do not use any products that contain nicotine or tobacco. These products include cigarettes, chewing tobacco, and vaping devices, such as e-cigarettes. If you need help quitting, ask your health care provider. Follow any instructions from your health care provider about diet. You may be instructed to limit foods that contain iodine. Keep all follow-up visits. You will need to have blood tests regularly so that your health care provider can monitor your condition. Where to find more information National Institute of Diabetes and Digestive and Kidney Diseases: niddk.nih.gov Contact a health care provider if: Your symptoms do not get better with treatment. You have a fever. You have abdominal pain. You feel dizzy. You are taking thyroid hormone replacement medicine and: You have   symptoms of depression. You feel like you  are tired all the time. You gain weight. Get help right away if: You have sudden, unexplained confusion or other mental changes. You have chest pain. You have fast or irregular heartbeats (palpitations). You have difficulty breathing. These symptoms may be an emergency. Get help right away. Call 911. Do not wait to see if the symptoms will go away. Do not drive yourself to the hospital. Summary The thyroid gland is a small gland located in the lower front part of the neck, just in front of the windpipe. Hyperthyroidism is when the thyroid gland is too active and produces too much of a hormone called thyroxine. The most common cause is Graves' disease, a disorder in which your immune system attacks the thyroid gland. Hyperthyroidism can cause various symptoms, such as unexplained weight loss, nervousness, inability to tolerate heat, or changes in your heartbeat. Treatment may include medicine to reduce the amount of thyroid hormone your body makes, radioiodine therapy, surgery, or medicines to manage symptoms. This information is not intended to replace advice given to you by your health care provider. Make sure you discuss any questions you have with your health care provider. Document Revised: 04/21/2021 Document Reviewed: 04/21/2021 Elsevier Patient Education  2023 Elsevier Inc.  

## 2021-12-29 ENCOUNTER — Other Ambulatory Visit: Payer: Self-pay | Admitting: Nurse Practitioner

## 2021-12-29 ENCOUNTER — Telehealth: Payer: Self-pay | Admitting: *Deleted

## 2021-12-29 DIAGNOSIS — E0590 Thyrotoxicosis, unspecified without thyrotoxic crisis or storm: Secondary | ICD-10-CM

## 2021-12-29 LAB — T3, FREE: T3, Free: 7.9 pg/mL — ABNORMAL HIGH (ref 2.0–4.4)

## 2021-12-29 LAB — THYROGLOBULIN ANTIBODY: Thyroglobulin Antibody: <1 IU/mL (ref 0.0–0.9)

## 2021-12-29 LAB — TSH: TSH: <0.005 u[IU]/mL — ABNORMAL LOW (ref 0.450–4.500)

## 2021-12-29 LAB — T4, FREE: Free T4: 2.31 ng/dL — ABNORMAL HIGH (ref 0.82–1.77)

## 2021-12-29 LAB — THYROID PEROXIDASE ANTIBODY: Thyroperoxidase Ab SerPl-aCnc: 13 IU/mL (ref 0–34)

## 2021-12-29 NOTE — Telephone Encounter (Signed)
Ok, I have placed the order.  Radiology at [facility] will reach out to him to schedule the date and time.

## 2021-12-29 NOTE — Progress Notes (Signed)
His thyroid antibody testing was negative, ruling out autoimmune thyroid dysfunction.    His labs are consistent with significantly overactive thyroid.  It is dangerous to let it stay this way for too long as it puts too much stress on the heart.  We have 2 options, order the uptake and scan to be done ASAP- which will allow me to see if this could be a thyroid nodule that is causing this over production of thyroid hormones allowing [place] to treat the root of the problem faster; or start on Methimazole 10 mg po twice daily to slow the thyroid hormone production down which will give [place] more time to think about long-term goals.  I know he was concerned about possibly needing medication the rest of his life and that wasn't something he was wanting to do.

## 2021-12-29 NOTE — Telephone Encounter (Signed)
-----   Message from Brita Romp, NP sent at 12/29/2021  7:49 AM EDT ----- His thyroid antibody testing was negative, ruling out autoimmune thyroid dysfunction.    His labs are consistent with significantly overactive thyroid.  It is dangerous to let it stay this way for too long as it puts too much stress on the heart.  We have 2 options, order the uptake and scan to be done ASAP- which will allow me to see i f this could be a thyroid nodule that is causing this over production of thyroid hormones allowing [place] to treat the root of the problem faster; or start on Methimazole 10 mg po twice daily to slow the thyroid hormone production down which will give us  more time to think about long-term goals.  I know he was concerned about possibly needing medication the rest of his life and that wasn't something he was wanting to do.

## 2021-12-29 NOTE — Telephone Encounter (Signed)
I talked with the patient. He would like to move forward with the uptake and scan. He ask that we let him know when and what he is to do for this, when it is scheduled.

## 2021-12-29 NOTE — Progress Notes (Signed)
Talked with the patient. He would like to move forward with uptake and scan.

## 2021-12-29 NOTE — Telephone Encounter (Signed)
Patient was called and made aware. 

## 2022-01-04 ENCOUNTER — Ambulatory Visit: Payer: Medicaid Other | Admitting: Nurse Practitioner

## 2022-01-09 ENCOUNTER — Encounter (HOSPITAL_COMMUNITY)
Admission: RE | Admit: 2022-01-09 | Discharge: 2022-01-09 | Disposition: A | Discharge status: Home / Self Care | Payer: Medicaid Other | Source: Ambulatory Visit | Attending: Nurse Practitioner | Admitting: Nurse Practitioner

## 2022-01-09 DIAGNOSIS — E0590 Thyrotoxicosis, unspecified without thyrotoxic crisis or storm: Secondary | ICD-10-CM | POA: Diagnosis present

## 2022-01-09 MED ORDER — SODIUM IODIDE I-123 7.4 MBQ CAPS
458.0000 | ORAL_CAPSULE | Freq: Once | ORAL | Status: AC
  Administered 2022-01-09: 458 via ORAL

## 2022-01-10 ENCOUNTER — Encounter (HOSPITAL_COMMUNITY)
Admission: RE | Admit: 2022-01-10 | Discharge: 2022-01-10 | Disposition: A | Discharge status: Home / Self Care | Payer: Medicaid Other | Source: Ambulatory Visit | Attending: Nurse Practitioner | Admitting: Nurse Practitioner

## 2022-01-18 ENCOUNTER — Encounter: Payer: Self-pay | Admitting: Emergency Medicine

## 2022-01-18 ENCOUNTER — Ambulatory Visit: Payer: Medicaid Other | Admitting: Nurse Practitioner

## 2022-01-18 ENCOUNTER — Encounter: Payer: Self-pay | Admitting: Nurse Practitioner

## 2022-01-18 VITALS — BP 118/78 | HR 101 | Ht 72.0 in | Wt 192.0 lb

## 2022-01-18 DIAGNOSIS — E041 Nontoxic single thyroid nodule: Secondary | ICD-10-CM | POA: Diagnosis not present

## 2022-01-18 DIAGNOSIS — E0590 Thyrotoxicosis, unspecified without thyrotoxic crisis or storm: Secondary | ICD-10-CM

## 2022-01-18 DIAGNOSIS — E0510 Thyrotoxicosis with toxic single thyroid nodule without thyrotoxic crisis or storm: Secondary | ICD-10-CM | POA: Diagnosis not present

## 2022-01-18 NOTE — Patient Instructions (Signed)
Radioiodine (I-131) Therapy for Hyperthyroidism  Radioiodine (I-131) therapy is a treatment for an overactive thyroid gland (hyperthyroidism). The thyroid is a gland in the neck that makes hormones that affect how the body processes food for energy (metabolism) and how the heart and brain function. This treatment involves swallowing a pill or liquid that contains I-131. I-131 is manufactured (synthetic) iodine that gives off radiation. After it is swallowed, the I-131 will be absorbed by the thyroid gland over the next few months. It will destroy thyroid cells and reverse hyperthyroidism. Tell a health care provider about: Any allergies you have. All medicines you are taking, including vitamins, herbs, eye drops, creams, and over-the-counter medicines. Any bleeding problems you have. Any surgeries you have had. Any medical conditions you have. Whether you are pregnant, may be pregnant, or have gone through menopause. The health care provider will also want to know: Whether you are breastfeeding. Whether you plan to have children in the next 2 years. Any contact you have with children or pregnant women. Your travel plans for the next 3 months. Whether you pass through radiation detectors for work or travel. What are the risks? Generally, this is a safe procedure. However, problems may occur, including: Damage to other structures or organs, such as the salivary glands. This could lead to dry mouth and loss of taste. Low sperm count. This may lead to temporary infertility. Sore throat or neck pain. This is temporary. Slightly increased risk of thyroid cancer. Nausea or vomiting. Hypothyroidism. This is a condition in which the thyroid gland does not make enough thyroid hormone. Radiation thyroiditis. This is the swelling of the thyroid gland that is caused by the radioiodine therapy. What happens before the procedure? When to stop eating and drinking Follow instructions from your health care  provider about what you may eat and drink. These may include: 2 hours before your procedure Stop eating all foods. Stop drinking all liquids. You may be allowed to take medicines with small sips of water. If you do not follow your health care provider's instructions, your procedure may be delayed or canceled. Medicines Ask your health care provider about: Changing or stopping your regular medicines. These include any diabetes medicines, blood thinners, or thyroid medicines. Taking over-the-counter medicines, vitamins, herbs, and supplements. General instructions Women may be asked to take a pregnancy test. Women who are breastfeeding should: Plan to stop at least 6 weeks before the procedure. Not go back to breastfeeding after the procedure until their health care provider approves. Plan to avoid contact with other people for 1 week after your treatment to protect others from exposure to radiation as it leaves your body. Avoiding contact with children and pregnant women is especially important. To do this, plan to stay home from work, arrange child care, and sleep alone. Plan to drive yourself home after treatment. Do not take public transportation. If you need someone to drive you home, sit as far away from the driver as possible. What happens during the procedure? You will be given a dose of I-131 to swallow. It may be a capsule or a liquid. Your thyroid gland will absorb the I-131 over the next 3 months. The treatment process will be complete in about 6 months. What happens after the procedure? You may need to stay in the hospital for 24 hours after your treatment. This depends on the requirements in your state. Follow instructions from your health care provider about: How to take care of yourself after the procedure. How to  protect others from exposure to radiation as it leaves your body. Summary Radioiodine (I-131) therapy is a treatment for an overactive thyroid gland  (hyperthyroidism). This treatment involves swallowing a capsule or liquid that contains I-131. I-131 is manufactured iodine that gives off radiation. Your thyroid gland will absorb the I-131 over the next 3 months. The I-131 destroys thyroid cells and reverses hyperthyroidism. Follow instructions from your health care provider about how to take care of yourself and how to protect other people from exposure to radiation after the procedure. This information is not intended to replace advice given to you by your health care provider. Make sure you discuss any questions you have with your health care provider. Document Revised: 04/21/2021 Document Reviewed: 04/21/2021 Elsevier Patient Education  [place].

## 2022-01-18 NOTE — Progress Notes (Signed)
01/18/2022     Endocrinology Follow Up Note    Subjective:    Patient ID: Jesus Graham, male    DOB: 14-Sep-1965, PCP Zhou-Talbert, Elwyn Lade, MD.   Past Medical History:  Diagnosis Date   Anxiety    Back pain    Cancer ([place])    skin cancer   Chronic pain of left knee    Depression    History of alcoholism ([place])    quit in 1995   HTN (hypertension)    PTSD (post-traumatic stress disorder)     Past Surgical History:  Procedure Laterality Date   COLONOSCOPY WITH PROPOFOL N/A 05/26/2019   Procedure: COLONOSCOPY WITH PROPOFOL;  Surgeon: Jesus Binder, MD;  Location: [facility];  Service: Endoscopy;  Laterality: N/A;  8:45am   left knee surgery     x 2   POLYPECTOMY  05/26/2019   Procedure: POLYPECTOMY;  Surgeon: Jesus Binder, MD;  Location: [facility];  Service: Endoscopy;;   VASECTOMY      Social History   Socioeconomic History   Marital status: Legally Separated    Spouse name: Not on file   Number of children: Not on file   Years of education: Not on file   Highest education level: Not on file  Occupational History   Not on file  Tobacco Use   Smoking status: Former    Types: Cigarettes    Quit date: 03/13/1999    Years since quitting: 22.8   Smokeless tobacco: Current   Tobacco comments:    Dips occasionally  Vaping Use   Vaping Use: Never used  Substance and Sexual Activity   Alcohol use: Not Currently   Drug use: Never   Sexual activity: Not on file  Other Topics Concern   Not on file  Social History Narrative   Not on file   Social Determinants of Health   Financial Resource Strain: Not on file  Food Insecurity: Not on file  Transportation Needs: Not on file  Physical Activity: Not on file  Stress: Not on file  Social Connections: Not on file    Family History  Problem Relation Age of Onset   Alcohol abuse Father    Colon cancer Neg Hx     Outpatient Encounter Medications as of 01/18/2022  Medication Sig   amLODipine  (NORVASC) 10 MG tablet Take 10 mg by mouth daily.   oxyCODONE-acetaminophen (PERCOCET/ROXICET) 5-325 MG tablet Take by mouth every 4 (four) hours as needed for severe pain.   OXYCONTIN 10 MG 12 hr tablet Take 10 mg by mouth 2 (two) times daily.   pregabalin (LYRICA) 150 MG capsule Take 150 mg by mouth 2 (two) times daily.   rosuvastatin (CRESTOR) 20 MG tablet Take 20 mg by mouth daily.   zolpidem (AMBIEN) 10 MG tablet Take 10 mg by mouth at bedtime as needed.   hydrochlorothiazide (HYDRODIURIL) 25 MG tablet Take 1 tablet (25 mg total) by mouth daily for 5 days.   No facility-administered encounter medications on file as of 01/18/2022.    ALLERGIES: Allergies  Allergen Reactions   Celexa [Citalopram] Other (See Comments)    Closes throat up   Codeine Nausea Only   Nsaids Nausea Only    VACCINATION STATUS:  There is no immunization history on file for this patient.   HPI  Jesus Graham is 56 y.o. male who presents today with a medical history as above. he is being seen in follow up after  being seen in consultation for hyperthyroidism requested by Zhou-Talbert, Elwyn Lade, MD.  he has been dealing with symptoms of anxiety, unexplained weight loss, palpitations, tremors, insomnia, and irritability on/off for 5 years. These symptoms are progressively worsening and troubling to him.  his most recent thyroid labs revealed suppressed TSH of < 0.01 and elevated Free T4 of 2.0 on 12/04/21.  he denies dysphagia, choking, shortness of breath, no recent voice change.    he denies any known family history of thyroid dysfunction and denies family hx of thyroid cancer. he denies personal history of goiter. he is not on any anti-thyroid medications nor on any thyroid hormone supplements. Denies use of Biotin containing supplements.  he is willing to proceed with appropriate work up and therapy for thyrotoxicosis.   Review of systems  Constitutional: + decreasing body weight, current Body mass index is  26.04 kg/m., no fatigue, no subjective hyperthermia, no subjective hypothermia, +insomnia Eyes: no blurry vision, no xerophthalmia ENT: no sore throat, no nodules palpated in throat, no dysphagia/odynophagia, no hoarseness Cardiovascular: no chest pain, no shortness of breath, + palpitations, no leg swelling Respiratory: no cough, no shortness of breath Gastrointestinal: no nausea/vomiting/diarrhea Musculoskeletal: no muscle/joint aches Skin: no rashes, no hyperemia Neurological: + tremors, no numbness, no tingling, no dizziness Psychiatric: no depression, + anxiety, + irritability   Objective:    BP 118/78 (BP Location: Left Arm, Patient Position: Sitting, Cuff Size: Large)   Pulse (!) 101   Ht 6' (1.829 m)   Wt 192 lb (87.1 kg)   BMI 26.04 kg/m   Wt Readings from Last 3 Encounters:  01/18/22 192 lb (87.1 kg)  12/28/21 196 lb (88.9 kg)  02/09/21 206 lb (93.4 kg)     BP Readings from Last 3 Encounters:  01/18/22 118/78  12/28/21 120/74  10/31/20 129/72                         Physical Exam- Limited  Constitutional:  Body mass index is 26.04 kg/m. , not in acute distress, anxious state of mind, avoids direct eye contact Eyes:  EOMI, no exophthalmos Neck: Supple Cardiovascular: RRR, no murmurs, rubs, or gallops, no edema Respiratory: Adequate breathing efforts, no crackles, rales, rhonchi, or wheezing Musculoskeletal: no gross deformities, strength intact in all four extremities, no gross restriction of joint movements Skin:  no rashes, no hyperemia Neurological: + tremor with outstretched hands, DTR normal in BLE   CMP     Component Value Date/Time   NA 137 10/31/2020 1048   K 3.6 10/31/2020 1048   CL 102 10/31/2020 1048   CO2 28 10/31/2020 1048   GLUCOSE 105 (H) 10/31/2020 1048   BUN 13 10/31/2020 1048   CREATININE 1.00 10/31/2020 1048   CALCIUM 9.1 10/31/2020 1048   PROT 6.8 10/31/2020 1048   ALBUMIN 4.0 10/31/2020 1048   AST 26 10/31/2020 1048   ALT 32  10/31/2020 1048   ALKPHOS 117 10/31/2020 1048   BILITOT 0.6 10/31/2020 1048   GFRNONAA >60 10/31/2020 1048   GFRAA >60 09/05/2019 1254     CBC    Component Value Date/Time   WBC 10.9 (H) 10/31/2020 1048   RBC 4.93 10/31/2020 1048   HGB 14.3 10/31/2020 1048   HCT 41.0 10/31/2020 1048   PLT 289 10/31/2020 1048   MCV 83.2 10/31/2020 1048   MCH 29.0 10/31/2020 1048   MCHC 34.9 10/31/2020 1048   RDW 12.8 10/31/2020 1048   LYMPHSABS 1.8 10/31/2020 1048  MONOABS 1.8 (H) 10/31/2020 1048   EOSABS 0.6 (H) 10/31/2020 1048   BASOSABS 0.1 10/31/2020 1048     Diabetic Labs (most recent): No results found for: "HGBA1C", "MICROALBUR"  Lipid Panel  No results found for: "CHOL", "TRIG", "HDL", "CHOLHDL", "VLDL", "LDLCALC", "LDLDIRECT", "LABVLDL"   Lab Results  Component Value Date   TSH <0.005 (L) 12/28/2021   TSH 0.01 (A) 12/04/2021   FREET4 2.31 (H) 12/28/2021    Thyroid US from 02/15/21 CLINICAL DATA:  Elevated TSH   EXAM: THYROID ULTRASOUND   TECHNIQUE: Ultrasound examination of the thyroid gland and adjacent soft tissues was performed.   COMPARISON:  None.   FINDINGS: Parenchymal Echotexture: Mildly heterogeneous   Isthmus: 0.4 cm   Right lobe: 5.3 x 1.5 x 1.3 cm   Left lobe: 5.1 x 3.1 x 3.1 cm   _________________________________________________________   Estimated total number of nodules >/= 1 cm: 1   Number of spongiform nodules >/=  2 cm not described below (TR1): 0   Number of mixed cystic and solid nodules >/= 1.5 cm not described below (TR2): 0   _________________________________________________________   Nodule # 1:   Location: Right; inferior   Maximum size: 0.9 cm; Other 2 dimensions: 0.7 x 0.7 cm   Composition: solid/almost completely solid (2)   Echogenicity: hypoechoic (2)   Shape: not taller-than-wide (0)   Margins: ill-defined (0)   Echogenic foci: none (0)   ACR TI-RADS total points: 4.   ACR TI-RADS risk category: TR4 (4-6  points).   ACR TI-RADS recommendations:   Given size (<0.9 cm) and appearance, this nodule does NOT meet TI-RADS criteria for biopsy or dedicated follow-up.   _________________________________________________________   Nodule # 2:   Location: Left; mid   Maximum size: 4.0 cm; Other 2 dimensions: 3.1 x 3.5 cm   Composition: mixed cystic and solid (1)   Echogenicity: isoechoic (1)   Shape: not taller-than-wide (0)   Margins: smooth (0)   Echogenic foci: none (0)   ACR TI-RADS total points: 2.   ACR TI-RADS risk category: TR2 (2 points).   ACR TI-RADS recommendations:   This nodule does NOT meet TI-RADS criteria for biopsy or dedicated follow-up.   _________________________________________________________   IMPRESSION: Bilateral thyroid nodules, which do not meet criteria for FNA or imaging surveillance.   The above is in keeping with the ACR TI-RADS recommendations - J Am Coll Radiol 2017;14:587-595.     Electronically Signed   By: Miachel Roux M.D.   On: 02/15/2021 10:20    Uptake and Scan from 01/10/22 CLINICAL DATA:  Hyperthyroidism   EXAM: THYROID SCAN AND UPTAKE - 4 AND 24 HOURS   TECHNIQUE: Following oral administration of I-123 capsule, anterior planar imaging was acquired at 24 hours. Thyroid uptake was calculated with a thyroid probe at 4-6 hours and 24 hours.   RADIOPHARMACEUTICALS:  485 uCi I-123 sodium iodide p.o.   COMPARISON:  Thyroid ultrasound 02/15/2021   FINDINGS: Large hot nodule occupying the majority of the LEFT thyroid lobe with a central area of photopenia, corresponding to the dominant LEFT lobe nodule with central cystic changes seen on prior ultrasound.   Suppression of uptake in RIGHT thyroid lobe.   No additional areas of increased or decreased tracer uptake.   4 hour I-123 uptake = 21.3% (normal 5-20%)   24 hour I-123 uptake = 33.9% (normal 10-30%)   IMPRESSION: Elevated 4 hour and 24 hour radio iodine  uptakes.   Large nodule LEFT thyroid lobe with  central cystic changes, associated with suppression of uptake of tracer in remaining thyroid tissue.   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     Electronically Signed   By: Lavonia Dana M.D.   On: 01/10/2022 12:23   Assessment & Plan:   1. Thyroid nodule 2.  Hyperthyroidism-unspecified  he is being seen at a kind request of Zhou-Talbert, Elwyn Lade, MD.  his history and most recent labs are reviewed, and he was examined clinically. Subjective and objective findings are consistent with thyrotoxicosis likely from primary hyperthyroidism. The potential risks of untreated thyrotoxicosis and the need for definitive therapy have been discussed in detail with him, and he agrees to proceed with diagnostic workup and treatment plan.   His repeat thyroid function tests are consistent with over-active thyroid.  He had uptake and scan which shows increase 4 hr uptake of 21.3% and 24 hr uptake of 33.9% with a large hot nodule in left thyroid gland with central cystic changes.  We discussed RAI ablation and decided to proceed.  He knows there is a chance that he may need thyroid hormone for the rest of his life but is willing to proceed.       -Patient is advised to maintain close follow up with Zhou-Talbert, Elwyn Lade, MD for primary care needs.    I spent 26 minutes in the care of the patient today including review of labs from Thyroid Function, CMP, and other relevant labs ; imaging/biopsy records (current and previous including abstractions from other facilities); face-to-face time discussing  his lab results and symptoms, medications doses, his options of short and long term treatment based on the latest standards of care / guidelines;   and documenting the encounter.  Jesus Graham  participated in the discussions, expressed understanding, and voiced agreement with the above plans.  All questions were answered to his  satisfaction. he is encouraged to contact clinic should he have any questions or concerns prior to his return visit.  Follow up plan: Return in about 2 months (around 03/20/2022) for Thyroid follow up, Previsit labs 6 weeks post RAI ablation.   Thank you for involving me in the care of this pleasant patient, and I will continue to update you with his progress.    Rayetta Pigg, [facility] [address] [place], [place] [address] Phone: [phone] Fax: [phone]  01/18/2022, 2:00 PM

## 2022-01-22 ENCOUNTER — Encounter: Payer: Self-pay | Admitting: Nurse Practitioner

## 2022-01-23 NOTE — Written Directive (Cosign Needed)
MOLECULAR IMAGING AND THERAPEUTICS WRITTEN DIRECTIVE   PATIENT NAME: Jesus Graham  PT DOB:   10/12/1965                                              MRN: [id number]  ---------------------------------------------------------------------------------------------------------------------   I-131 WHOLE THYROID THERAPY (NON-CANCER)    RADIOPHARMACEUTICAL:   Iodine-131 Capsule    PRESCRIBED DOSE FOR ADMINISTRATION:    ROUTE OFADMINISTRATION: PO   DIAGNOSIS:  Toxic Thyroid Adenoma   REFERRING PHYSICIAN: Dr. Stan Head   TSH:    Lab Results  Component Value Date   TSH <0.005 (L) 12/28/2021   TSH 0.01 (A) 12/04/2021     PRIOR I-131 THERAPY (Date and Dose): N/A   PRIOR RADIOLOGY EXAMS (Results and Date): NM THYROID MULT UPTAKE W/IMAGING  Result Date: 01/10/2022 CLINICAL DATA:  Hyperthyroidism EXAM: THYROID SCAN AND UPTAKE - 4 AND 24 HOURS TECHNIQUE: Following oral administration of I-123 capsule, anterior planar imaging was acquired at 24 hours. Thyroid uptake was calculated with a thyroid probe at 4-6 hours and 24 hours. RADIOPHARMACEUTICALS:  485 uCi I-123 sodium iodide p.o. COMPARISON:  Thyroid ultrasound 02/15/2021 FINDINGS: Large hot nodule occupying the majority of the LEFT thyroid lobe with a central area of photopenia, corresponding to the dominant LEFT lobe nodule with central cystic changes seen on prior ultrasound. Suppression of uptake in RIGHT thyroid lobe. No additional areas of increased or decreased tracer uptake. 4 hour I-123 uptake = 21.3% (normal 5-20%) 24 hour I-123 uptake = 33.9% (normal 10-30%) IMPRESSION: Elevated 4 hour and 24 hour radio iodine uptakes. Large nodule LEFT thyroid lobe with central cystic changes, associated with suppression of uptake of tracer in remaining thyroid tissue. 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 Electronically Signed   By: Lavonia Dana M.D.   On: 01/10/2022 12:23   US THYROID  Result Date:  02/15/2021 CLINICAL DATA:  Elevated TSH EXAM: THYROID ULTRASOUND TECHNIQUE: Ultrasound examination of the thyroid gland and adjacent soft tissues was performed. COMPARISON:  None. FINDINGS: Parenchymal Echotexture: Mildly heterogeneous Isthmus: 0.4 cm Right lobe: 5.3 x 1.5 x 1.3 cm Left lobe: 5.1 x 3.1 x 3.1 cm _________________________________________________________ Estimated total number of nodules >/= 1 cm: 1 Number of spongiform nodules >/=  2 cm not described below (TR1): 0 Number of mixed cystic and solid nodules >/= 1.5 cm not described below (TR2): 0 _________________________________________________________ Nodule # 1: Location: Right; inferior Maximum size: 0.9 cm; Other 2 dimensions: 0.7 x 0.7 cm Composition: solid/almost completely solid (2) Echogenicity: hypoechoic (2) Shape: not taller-than-wide (0) Margins: ill-defined (0) Echogenic foci: none (0) ACR TI-RADS total points: 4. ACR TI-RADS risk category: TR4 (4-6 points). ACR TI-RADS recommendations: Given size (<0.9 cm) and appearance, this nodule does NOT meet TI-RADS criteria for biopsy or dedicated follow-up. _________________________________________________________ Nodule # 2: Location: Left; mid Maximum size: 4.0 cm; Other 2 dimensions: 3.1 x 3.5 cm Composition: mixed cystic and solid (1) Echogenicity: isoechoic (1) Shape: not taller-than-wide (0) Margins: smooth (0) Echogenic foci: none (0) ACR TI-RADS total points: 2. ACR TI-RADS risk category: TR2 (2 points). ACR TI-RADS recommendations: This nodule does NOT meet TI-RADS criteria for biopsy or dedicated follow-up. _________________________________________________________ IMPRESSION: Bilateral thyroid nodules, which do not meet criteria for FNA or imaging surveillance. The above is in keeping with the ACR TI-RADS recommendations - J Am Coll Radiol 2017;14:587-595. Electronically Signed  By: Sharen Heck  Mir M.D.   On: 02/15/2021 10:20      ADDITIONAL PHYSICIAN COMMENTS/NOTES   AUTHORIZED USER  SIGNATURE & TIME STAMP:

## 2022-02-09 ENCOUNTER — Encounter (HOSPITAL_COMMUNITY)
Admission: RE | Admit: 2022-02-09 | Discharge: 2022-02-09 | Disposition: A | Discharge status: Home / Self Care | Payer: Medicaid Other | Source: Ambulatory Visit | Attending: Nurse Practitioner | Admitting: Nurse Practitioner

## 2022-02-09 ENCOUNTER — Encounter (HOSPITAL_COMMUNITY): Payer: Self-pay

## 2022-02-09 DIAGNOSIS — E041 Nontoxic single thyroid nodule: Secondary | ICD-10-CM | POA: Diagnosis present

## 2022-02-09 DIAGNOSIS — E0510 Thyrotoxicosis with toxic single thyroid nodule without thyrotoxic crisis or storm: Secondary | ICD-10-CM | POA: Insufficient documentation

## 2022-02-09 DIAGNOSIS — E0590 Thyrotoxicosis, unspecified without thyrotoxic crisis or storm: Secondary | ICD-10-CM | POA: Diagnosis present

## 2022-02-09 MED ORDER — SODIUM IODIDE I 131 CAPSULE
30.0000 | Freq: Once | INTRAVENOUS | Status: AC | PRN
  Administered 2022-02-09: 29.7 via ORAL

## 2022-03-10 LAB — TSH: TSH: 0.328 u[IU]/mL — ABNORMAL LOW (ref 0.450–4.500)

## 2022-03-10 LAB — T3, FREE: T3, Free: 1.9 pg/mL — ABNORMAL LOW (ref 2.0–4.4)

## 2022-03-10 LAB — T4, FREE: Free T4: 0.53 ng/dL — ABNORMAL LOW (ref 0.82–1.77)

## 2022-03-15 ENCOUNTER — Encounter: Payer: Self-pay | Admitting: Nurse Practitioner

## 2022-03-15 ENCOUNTER — Ambulatory Visit: Payer: Medicaid Other | Admitting: Nurse Practitioner

## 2022-03-15 VITALS — BP 137/81 | HR 111 | Ht 72.0 in | Wt 196.0 lb

## 2022-03-15 DIAGNOSIS — F411 Generalized anxiety disorder: Secondary | ICD-10-CM | POA: Diagnosis not present

## 2022-03-15 DIAGNOSIS — E0510 Thyrotoxicosis with toxic single thyroid nodule without thyrotoxic crisis or storm: Secondary | ICD-10-CM | POA: Diagnosis not present

## 2022-03-15 DIAGNOSIS — E0590 Thyrotoxicosis, unspecified without thyrotoxic crisis or storm: Secondary | ICD-10-CM | POA: Diagnosis not present

## 2022-03-15 DIAGNOSIS — F4310 Post-traumatic stress disorder, unspecified: Secondary | ICD-10-CM

## 2022-03-15 NOTE — Progress Notes (Signed)
03/15/2022     Endocrinology Follow Up Note    Subjective:    Patient ID: Jesus Graham, male    DOB: November 29, 1965, PCP Zhou-Talbert, Elwyn Lade, MD.   Past Medical History:  Diagnosis Date   Anxiety    Back pain    Cancer ([place])    skin cancer   Chronic pain of left knee    Depression    History of alcoholism ([place])    quit in 1995   HTN (hypertension)    PTSD (post-traumatic stress disorder)     Past Surgical History:  Procedure Laterality Date   COLONOSCOPY WITH PROPOFOL N/A 05/26/2019   Procedure: COLONOSCOPY WITH PROPOFOL;  Surgeon: Danie Binder, MD;  Location: [facility];  Service: Endoscopy;  Laterality: N/A;  8:45am   left knee surgery     x 2   POLYPECTOMY  05/26/2019   Procedure: POLYPECTOMY;  Surgeon: Danie Binder, MD;  Location: [facility];  Service: Endoscopy;;   VASECTOMY      Social History   Socioeconomic History   Marital status: Legally Separated    Spouse name: Not on file   Number of children: Not on file   Years of education: Not on file   Highest education level: Not on file  Occupational History   Not on file  Tobacco Use   Smoking status: Former    Types: Cigarettes    Quit date: 03/13/1999    Years since quitting: 23.0   Smokeless tobacco: Current   Tobacco comments:    Dips occasionally  Vaping Use   Vaping Use: Never used  Substance and Sexual Activity   Alcohol use: Not Currently   Drug use: Never   Sexual activity: Not on file  Other Topics Concern   Not on file  Social History Narrative   Not on file   Social Determinants of Health   Financial Resource Strain: Not on file  Food Insecurity: Not on file  Transportation Needs: Not on file  Physical Activity: Not on file  Stress: Not on file  Social Connections: Not on file    Family History  Problem Relation Age of Onset   Alcohol abuse Father    Colon cancer Neg Hx     Outpatient Encounter Medications as of 03/15/2022  Medication Sig   amLODipine  (NORVASC) 10 MG tablet Take 10 mg by mouth daily.   atorvastatin (LIPITOR) 40 MG tablet Take 40 mg by mouth daily.   oxyCODONE-acetaminophen (PERCOCET/ROXICET) 5-325 MG tablet Take by mouth every 4 (four) hours as needed for severe pain.   OXYCONTIN 10 MG 12 hr tablet Take 10 mg by mouth 2 (two) times daily.   pregabalin (LYRICA) 150 MG capsule Take 150 mg by mouth 2 (two) times daily.   zolpidem (AMBIEN) 10 MG tablet Take 10 mg by mouth at bedtime as needed.   hydrochlorothiazide (HYDRODIURIL) 25 MG tablet Take 1 tablet (25 mg total) by mouth daily for 5 days.   rosuvastatin (CRESTOR) 20 MG tablet Take 20 mg by mouth daily. (Patient not taking: Reported on 03/15/2022)   No facility-administered encounter medications on file as of 03/15/2022.    ALLERGIES: Allergies  Allergen Reactions   Celexa [Citalopram] Other (See Comments)    Closes throat up   Codeine Nausea Only   Nsaids Nausea Only    VACCINATION STATUS:  There is no immunization history on file for this patient.   HPI  Jesus Graham is 57  y.o. male who presents today with a medical history as above. he is being seen in follow up after being seen in consultation for hyperthyroidism requested by Zhou-Talbert, Elwyn Lade, MD.  he has been dealing with symptoms of anxiety, unexplained weight loss, palpitations, tremors, insomnia, and irritability on/off for 5 years. These symptoms are progressively worsening and troubling to him.  his most recent thyroid labs revealed suppressed TSH of < 0.01 and elevated Free T4 of 2.0 on 12/04/21.  he denies dysphagia, choking, shortness of breath, no recent voice change.    he denies any known family history of thyroid dysfunction and denies family hx of thyroid cancer. he denies personal history of goiter. he is not on any anti-thyroid medications nor on any thyroid hormone supplements. Denies use of Biotin containing supplements.  he is willing to proceed with appropriate work up and therapy for  thyrotoxicosis.  He had RAI treatment for toxic thyroid nodule on 02/09/22.   Review of systems  Constitutional: + stable body weight, current Body mass index is 26.58 kg/m., no fatigue, no subjective hyperthermia, no subjective hypothermia, +insomnia Eyes: no blurry vision, no xerophthalmia ENT: no sore throat, no nodules palpated in throat, no dysphagia/odynophagia, no hoarseness Cardiovascular: no chest pain, no shortness of breath, + palpitations, no leg swelling Respiratory: no cough, no shortness of breath Gastrointestinal: no nausea/vomiting/diarrhea Musculoskeletal: no muscle/joint aches Skin: no rashes, no hyperemia Neurological: + tremors, no numbness, no tingling, no dizziness Psychiatric: no depression, + anxiety, + irritability   Objective:    BP 137/81 (BP Location: Left Arm, Patient Position: Sitting, Cuff Size: Large)   Pulse (!) 111   Ht 6' (1.829 m)   Wt 196 lb (88.9 kg)   BMI 26.58 kg/m   Wt Readings from Last 3 Encounters:  03/15/22 196 lb (88.9 kg)  01/18/22 192 lb (87.1 kg)  12/28/21 196 lb (88.9 kg)     BP Readings from Last 3 Encounters:  03/15/22 137/81  01/18/22 118/78  12/28/21 120/74                         Physical Exam- Limited  Constitutional:  Body mass index is 26.58 kg/m. , not in acute distress, anxious state of mind, trouble sitting still Eyes:  EOMI, no exophthalmos Neck: Supple Cardiovascular: RRR, no murmurs, rubs, or gallops, no edema Respiratory: Adequate breathing efforts, no crackles, rales, rhonchi, or wheezing Musculoskeletal: no gross deformities, strength intact in all four extremities, no gross restriction of joint movements Skin:  no rashes, no hyperemia Neurological: + tremor with outstretched hands, DTR normal in BLE   CMP     Component Value Date/Time   NA 137 10/31/2020 1048   K 3.6 10/31/2020 1048   CL 102 10/31/2020 1048   CO2 28 10/31/2020 1048   GLUCOSE 105 (H) 10/31/2020 1048   BUN 13 10/31/2020  1048   CREATININE 1.00 10/31/2020 1048   CALCIUM 9.1 10/31/2020 1048   PROT 6.8 10/31/2020 1048   ALBUMIN 4.0 10/31/2020 1048   AST 26 10/31/2020 1048   ALT 32 10/31/2020 1048   ALKPHOS 117 10/31/2020 1048   BILITOT 0.6 10/31/2020 1048   GFRNONAA >60 10/31/2020 1048   GFRAA >60 09/05/2019 1254     CBC    Component Value Date/Time   WBC 10.9 (H) 10/31/2020 1048   RBC 4.93 10/31/2020 1048   HGB 14.3 10/31/2020 1048   HCT 41.0 10/31/2020 1048   PLT 289 10/31/2020 1048  MCV 83.2 10/31/2020 1048   MCH 29.0 10/31/2020 1048   MCHC 34.9 10/31/2020 1048   RDW 12.8 10/31/2020 1048   LYMPHSABS 1.8 10/31/2020 1048   MONOABS 1.8 (H) 10/31/2020 1048   EOSABS 0.6 (H) 10/31/2020 1048   BASOSABS 0.1 10/31/2020 1048     Diabetic Labs (most recent): No results found for: "HGBA1C", "MICROALBUR"  Lipid Panel  No results found for: "CHOL", "TRIG", "HDL", "CHOLHDL", "VLDL", "LDLCALC", "LDLDIRECT", "LABVLDL"   Lab Results  Component Value Date   TSH 0.328 (L) 03/09/2022   TSH <0.005 (L) 12/28/2021   TSH 0.01 (A) 12/04/2021   FREET4 0.53 (L) 03/09/2022   FREET4 2.31 (H) 12/28/2021    Thyroid US from 02/15/21 CLINICAL DATA:  Elevated TSH   EXAM: THYROID ULTRASOUND   TECHNIQUE: Ultrasound examination of the thyroid gland and adjacent soft tissues was performed.   COMPARISON:  None.   FINDINGS: Parenchymal Echotexture: Mildly heterogeneous   Isthmus: 0.4 cm   Right lobe: 5.3 x 1.5 x 1.3 cm   Left lobe: 5.1 x 3.1 x 3.1 cm   _________________________________________________________   Estimated total number of nodules >/= 1 cm: 1   Number of spongiform nodules >/=  2 cm not described below (TR1): 0   Number of mixed cystic and solid nodules >/= 1.5 cm not described below (TR2): 0   _________________________________________________________   Nodule # 1:   Location: Right; inferior   Maximum size: 0.9 cm; Other 2 dimensions: 0.7 x 0.7 cm   Composition:  solid/almost completely solid (2)   Echogenicity: hypoechoic (2)   Shape: not taller-than-wide (0)   Margins: ill-defined (0)   Echogenic foci: none (0)   ACR TI-RADS total points: 4.   ACR TI-RADS risk category: TR4 (4-6 points).   ACR TI-RADS recommendations:   Given size (<0.9 cm) and appearance, this nodule does NOT meet TI-RADS criteria for biopsy or dedicated follow-up.   _________________________________________________________   Nodule # 2:   Location: Left; mid   Maximum size: 4.0 cm; Other 2 dimensions: 3.1 x 3.5 cm   Composition: mixed cystic and solid (1)   Echogenicity: isoechoic (1)   Shape: not taller-than-wide (0)   Margins: smooth (0)   Echogenic foci: none (0)   ACR TI-RADS total points: 2.   ACR TI-RADS risk category: TR2 (2 points).   ACR TI-RADS recommendations:   This nodule does NOT meet TI-RADS criteria for biopsy or dedicated follow-up.   _________________________________________________________   IMPRESSION: Bilateral thyroid nodules, which do not meet criteria for FNA or imaging surveillance.   The above is in keeping with the ACR TI-RADS recommendations - J Am Coll Radiol 2017;14:587-595.     Electronically Signed   By: Miachel Roux M.D.   On: 02/15/2021 10:20    Uptake and Scan from 01/10/22 CLINICAL DATA:  Hyperthyroidism   EXAM: THYROID SCAN AND UPTAKE - 4 AND 24 HOURS   TECHNIQUE: Following oral administration of I-123 capsule, anterior planar imaging was acquired at 24 hours. Thyroid uptake was calculated with a thyroid probe at 4-6 hours and 24 hours.   RADIOPHARMACEUTICALS:  485 uCi I-123 sodium iodide p.o.   COMPARISON:  Thyroid ultrasound 02/15/2021   FINDINGS: Large hot nodule occupying the majority of the LEFT thyroid lobe with a central area of photopenia, corresponding to the dominant LEFT lobe nodule with central cystic changes seen on prior ultrasound.   Suppression of uptake in RIGHT thyroid  lobe.   No additional areas of increased or decreased  tracer uptake.   4 hour I-123 uptake = 21.3% (normal 5-20%)   24 hour I-123 uptake = 33.9% (normal 10-30%)   IMPRESSION: Elevated 4 hour and 24 hour radio iodine uptakes.   Large nodule LEFT thyroid lobe with central cystic changes, associated with suppression of uptake of tracer in remaining thyroid tissue.   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     Electronically Signed   By: Lavonia Dana M.D.   On: 01/10/2022 12:23   Latest Reference Range & Units 12/04/21 00:00 12/28/21 10:19 03/09/22 12:08  TSH 0.450 - 4.500 uIU/mL 0.01 ! (E) <0.005 (L) 0.328 (L)  Triiodothyronine,Free,Serum 2.0 - 4.4 pg/mL  7.9 (H) 1.9 (L)  T4,Free(Direct) 0.82 - 1.77 ng/dL  2.31 (H) 0.53 (L)  Thyroperoxidase Ab SerPl-aCnc 0 - 34 IU/mL  13   Thyroglobulin Antibody 0.0 - 0.9 IU/mL  <1.0   !: Data is abnormal (L): Data is abnormally low (H): Data is abnormally high (E): External lab result Assessment & Plan:   1. Thyroid nodule 2.  Hyperthyroidism-r/t toxic nodule  he is being seen at a kind request of Zhou-Talbert, Elwyn Lade, MD.  his history and most recent labs are reviewed, and he was examined clinically. Subjective and objective findings are consistent with thyrotoxicosis likely from primary hyperthyroidism. The potential risks of untreated thyrotoxicosis and the need for definitive therapy have been discussed in detail with him, and he agrees to proceed with diagnostic workup and treatment plan.   He had RAI treatment on 02/09/22 for toxic nodule.  His repeat thyroid labs are suggestive of successful treatment.  TSH still suppressed but improved, Free T3 and Free T4 now low.  He does not report any improvement in his symptoms.  Will plan to wait on thyroid hormone replacement until next set of labs return.  He was aware of the chances that the RAI may affect his whole gland and decided to proceed, but he seems stressed at the  fact he may need thyroid hormone moving forward.  3. Anxiety 4. PTSD-per patient report  He is still anxious, irritable, has trouble sleeping but he also has known mental health issues which he has been seen by specialist for in the past but does not feel like his voice is being heard.  I did enter a new referral to psychiatry in [place] for second opinion at his request.       -Patient is advised to maintain close follow up with Zhou-Talbert, Elwyn Lade, MD for primary care needs.   I spent 35 minutes in the care of the patient today including review of labs from Thyroid Function, CMP, and other relevant labs ; imaging/biopsy records (current and previous including abstractions from other facilities); face-to-face time discussing  his lab results and symptoms, medications doses, his options of short and long term treatment based on the latest standards of care / guidelines;   and documenting the encounter.  Jesus Graham  participated in the discussions, expressed understanding, and voiced agreement with the above plans.  All questions were answered to his satisfaction. he is encouraged to contact clinic should he have any questions or concerns prior to his return visit.  Follow up plan: Return in about 2 months (around 05/14/2022) for Thyroid follow up, Previsit labs.   Thank you for involving me in the care of this pleasant patient, and I will continue to update you with his progress.   Rayetta Pigg, FNP-BC [facility] [address]  [place], [place] [address] Phone: [phone] Fax: [phone]  03/15/2022, 2:46 PM

## 2022-03-25 IMAGING — CR DG CHEST 2V
1 series · 2 of 2 positions shown · non-contrast
Comparison: None.

CLINICAL DATA: Lower extremity swelling

EXAM:
CHEST - 2 VIEW

[Series 1: dg chest 2 view · 0.14mm/px · 2 of 2 slices shown]
[im 1/2]
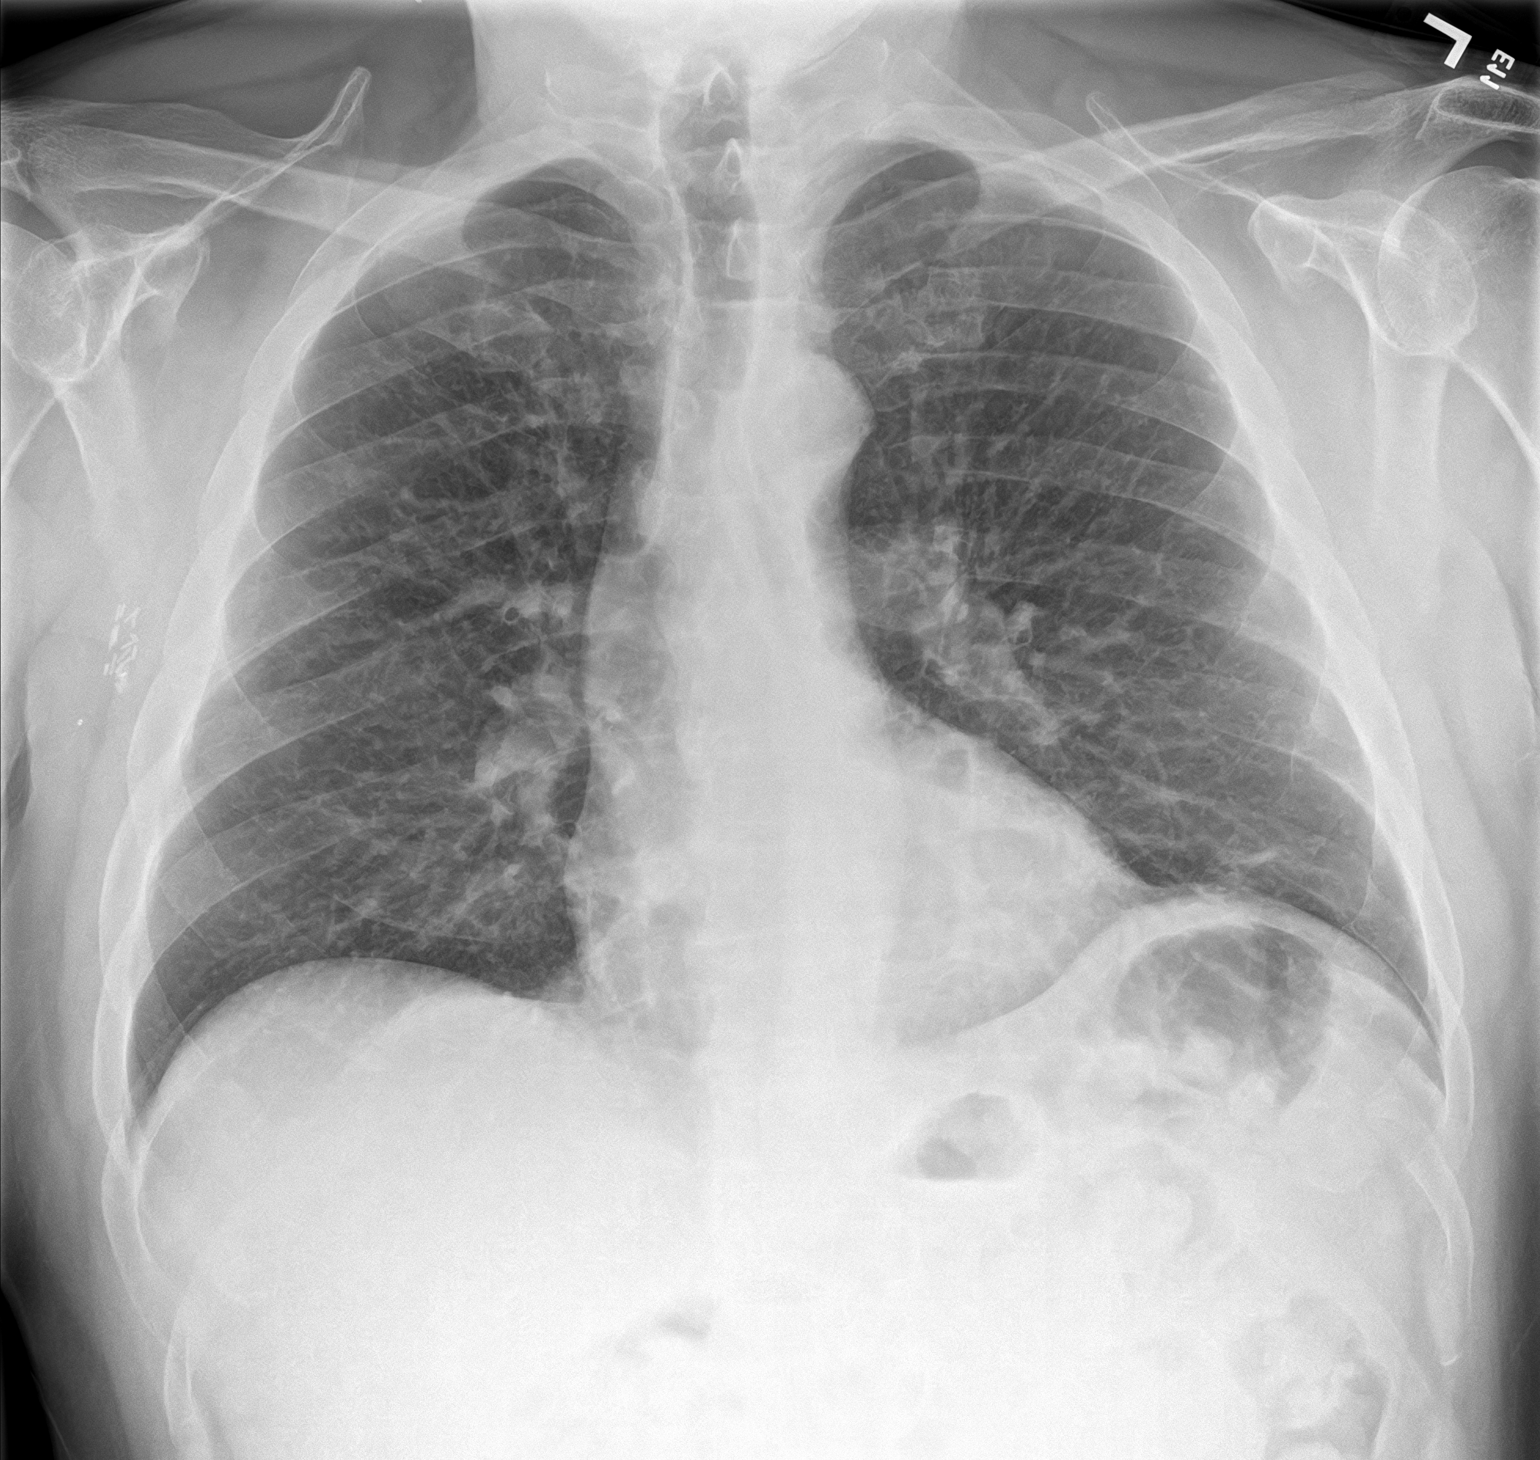
[im 2/2]
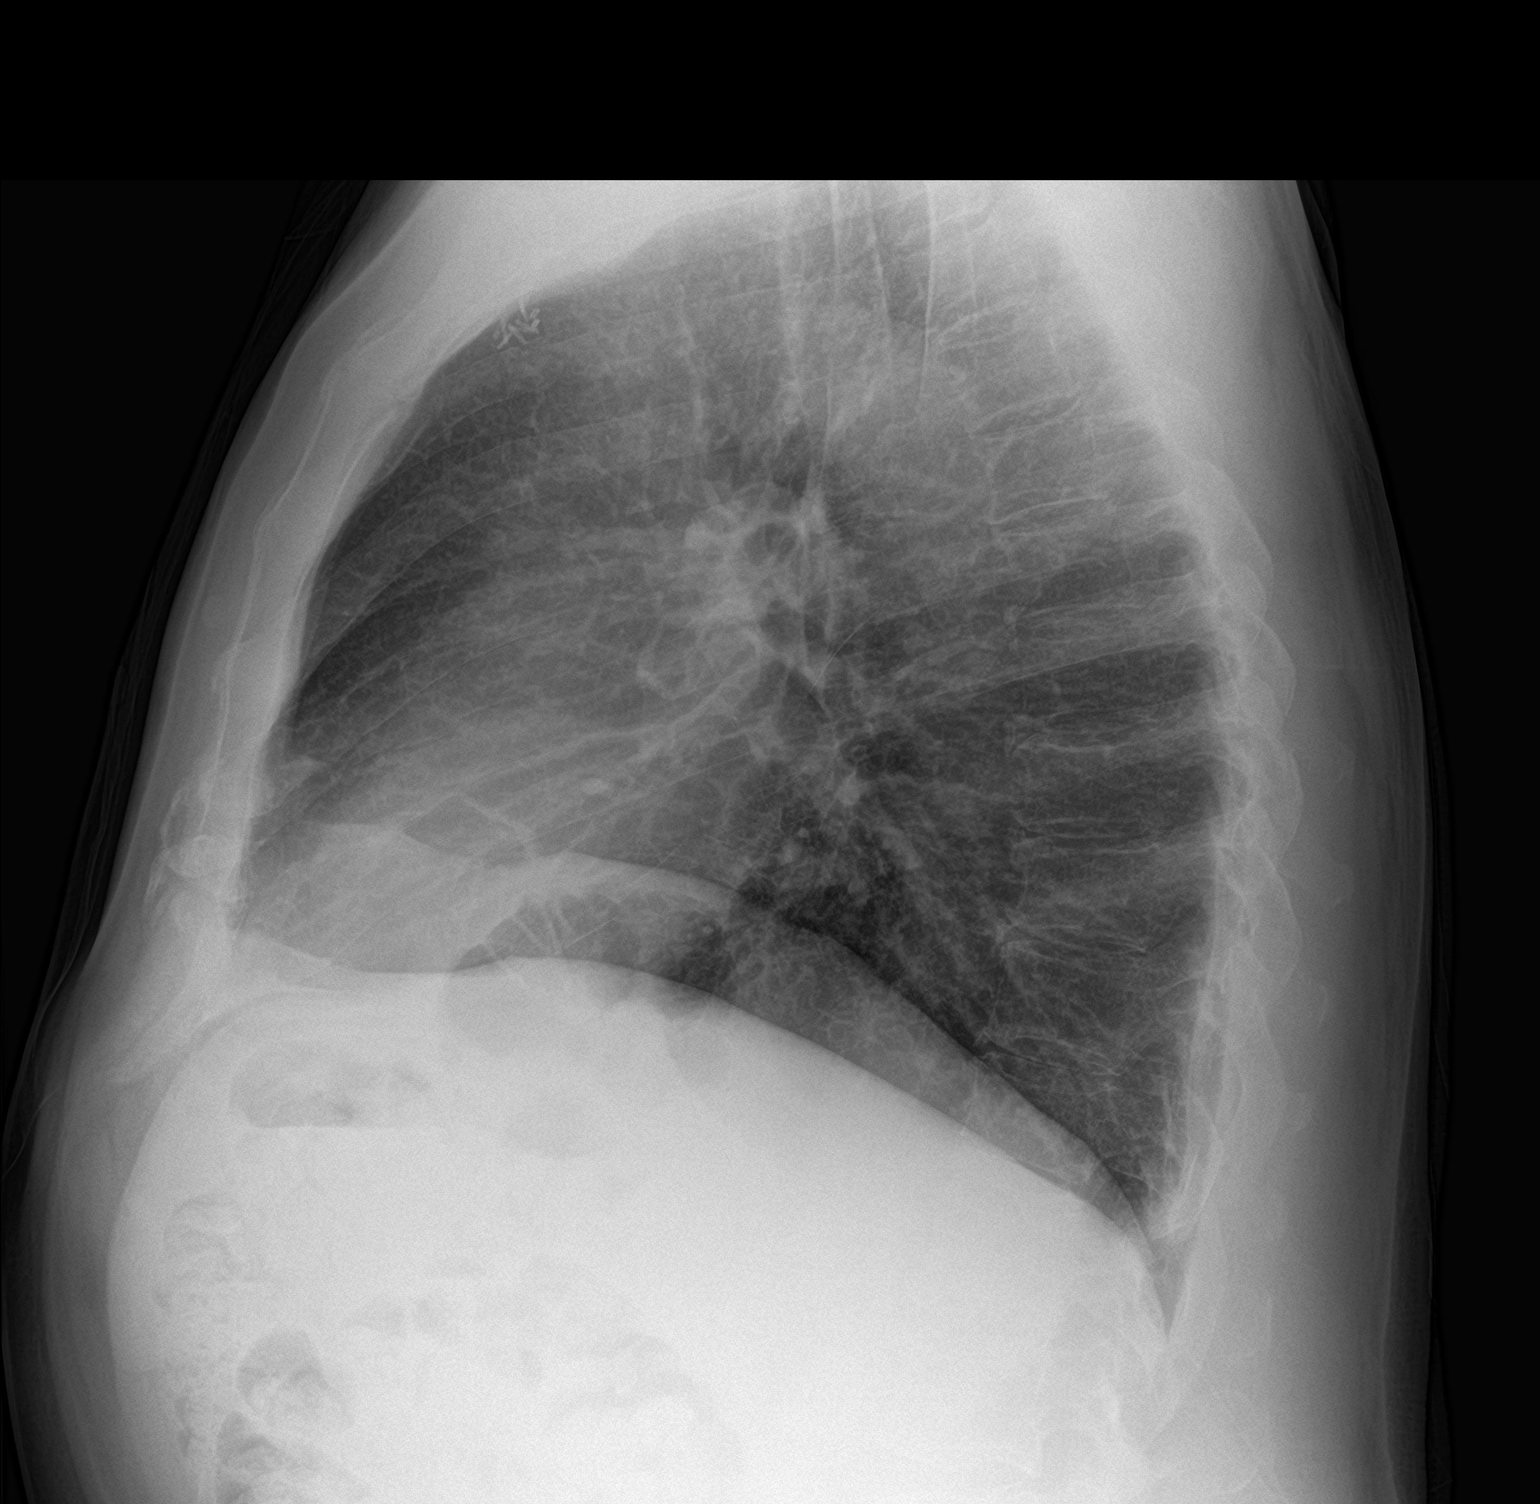

[2 of 2 positions shown; findings below may reference images not displayed]

FINDINGS: The heart size and mediastinal contours are within normal limits.
Linear scarring or atelectasis within the left lung base. 6 mm
nodular density projects within the central aspect of the mid to
lower lung field seen on lateral view without definite corresponding
finding on the frontal view. No pleural effusion or pneumothorax.
Surgical clips in the region of the right axilla. The visualized
skeletal structures are unremarkable.
IMPRESSION: 1. Nonspecific 6 mm nodular density projects within the central
aspect of the mid to lower lung field seen on lateral view without
definite corresponding finding on the frontal view. Further
evaluation with chest CT is recommended.
2. No evidence of congestive heart failure as clinically questioned.

## 2022-04-26 ENCOUNTER — Encounter (HOSPITAL_COMMUNITY): Payer: Self-pay | Admitting: Psychiatry

## 2022-04-26 ENCOUNTER — Ambulatory Visit (HOSPITAL_COMMUNITY): Payer: Medicaid Other | Admitting: Psychiatry

## 2022-04-26 ENCOUNTER — Telehealth (HOSPITAL_COMMUNITY): Payer: Self-pay | Admitting: *Deleted

## 2022-04-26 ENCOUNTER — Encounter: Payer: Self-pay | Admitting: Nurse Practitioner

## 2022-04-26 DIAGNOSIS — F4310 Post-traumatic stress disorder, unspecified: Secondary | ICD-10-CM | POA: Diagnosis not present

## 2022-04-26 NOTE — Telephone Encounter (Signed)
Pt called nurse line and spoke with this nurse after ending appointment with Dr. Nelida Gores. Pt asked writer if he could see "a real doctor" and would pay cash if necessary. Pt also has Medicaid. Pt stated that he was "in crisis" and needed to see "a real doctor" in person. Pt stated that he was asked by provider if he had access to guns and this upset pt but he denies access. Pt advised that [facility] does take walk ins. Pt stated that he'd been there before and also when he went to closest ED says he was told his insurance wouldn't pay because he had an upcoming appointment scheduled. Pt says he does not need a wellness check and does not have access to weapons but wants to see an MD in person. Pt refused crisis line information stating he's been given that information before and just wants to see provider, MD, in person. Writer provided phone #'s for [facility], [place], and address. Pt encouraged to call this nurse back if he is unable to get the assist that he is seeking. Dr. Nelida Gores advised of conversation.
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
## 2022-04-27 NOTE — Telephone Encounter (Signed)
Good morning Jesus Graham, I received the following message from Jesus Graham regarding some mistakes he found about his visit when reading the visit note.  Just wanted to forward to you.  Have a great Friday!

## 2022-04-30 ENCOUNTER — Encounter (HOSPITAL_COMMUNITY): Payer: Self-pay | Admitting: Psychiatry

## 2022-05-14 ENCOUNTER — Ambulatory Visit: Payer: Medicaid Other | Admitting: Nurse Practitioner

## 2022-07-30 IMAGING — CT CT CHEST LUNG CANCER SCREENING LOW DOSE W/O CM
2 of 4 series · 15 of 36 positions shown, 18 images · non-contrast
Comparison: None.

CLINICAL DATA: Former smoker with 30 pack-year history

EXAM:
CT CHEST WITHOUT CONTRAST LOW-DOSE FOR LUNG CANCER SCREENING
TECHNIQUE: Multidetector CT imaging of the chest was performed following the
standard protocol without IV contrast.

[Series 4: lungs · axial · 0.73mm/px · z∈[+1317,+1597]mm · 12 of 308 slices shown, 15 images]
[im 14/308  mediastinal]
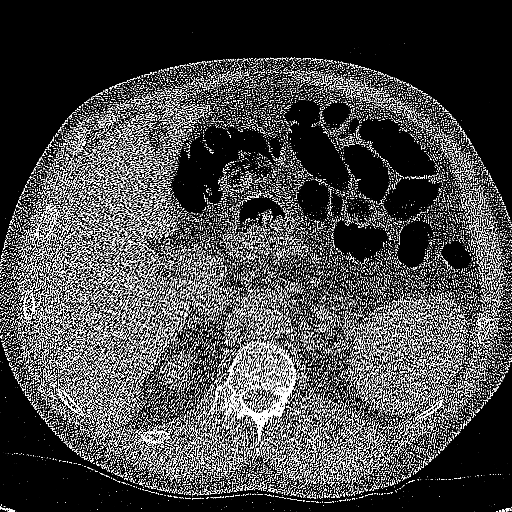
[im 14/308  lung]
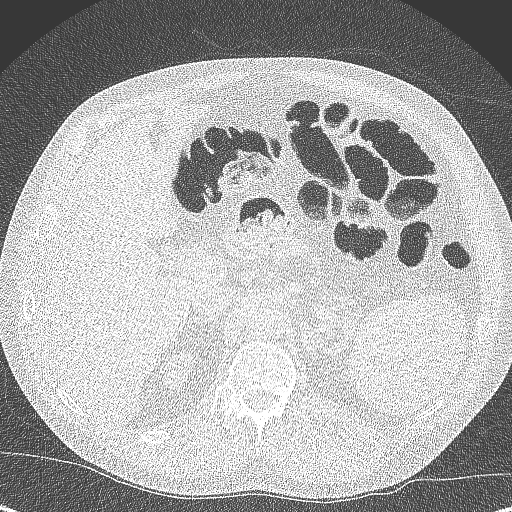
[im 42/308  lung]
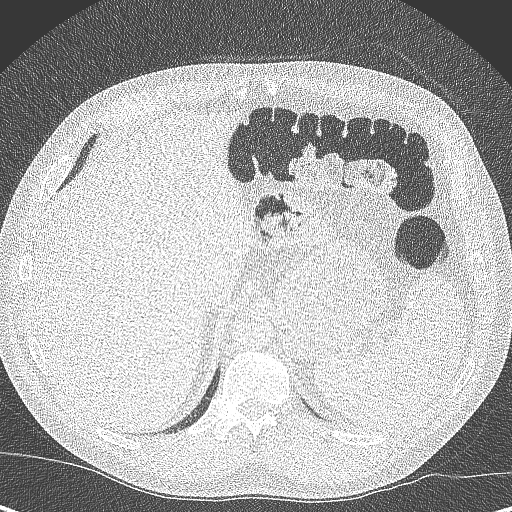
[im 70/308  lung]
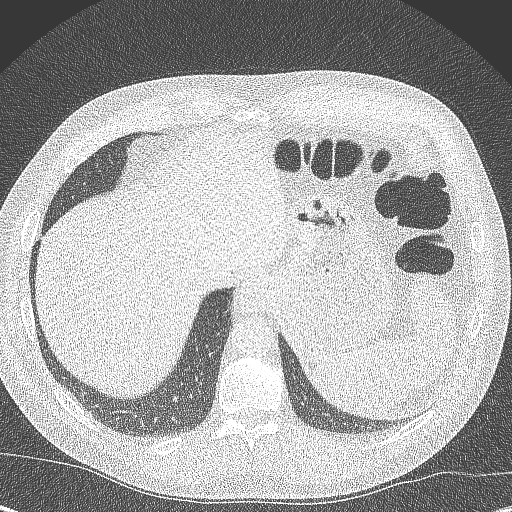
[im 98/308  lung]
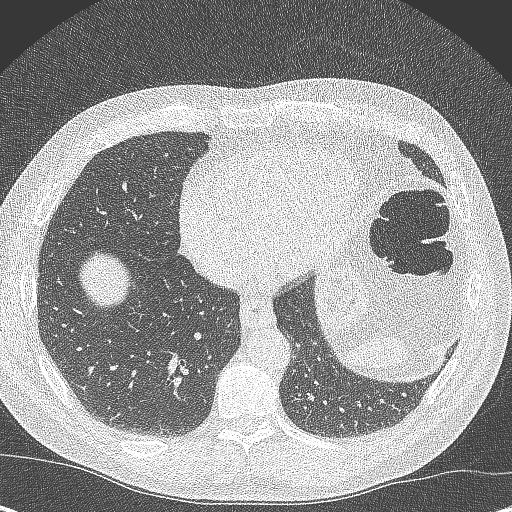
[im 112/308  mediastinal]
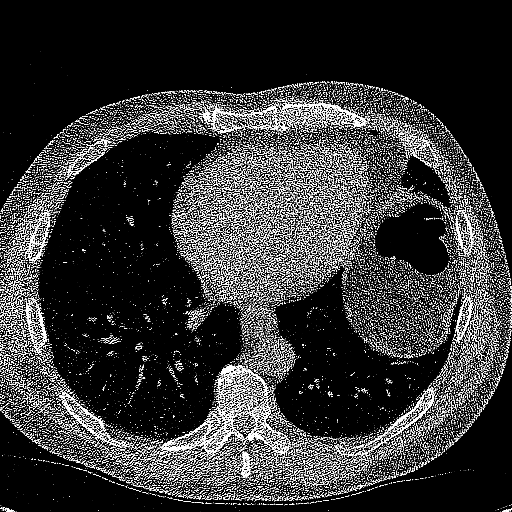
[im 112/308  lung]
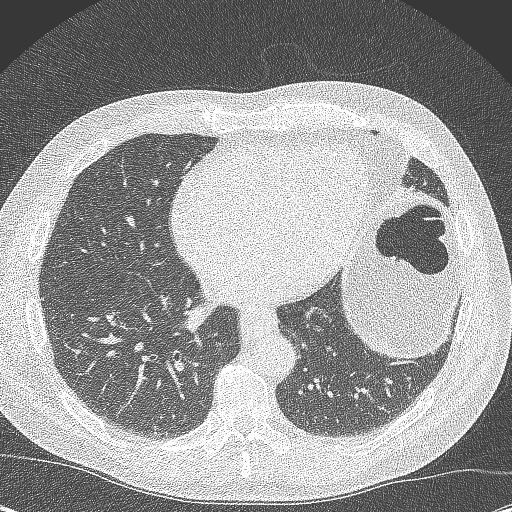
[im 140/308  lung]
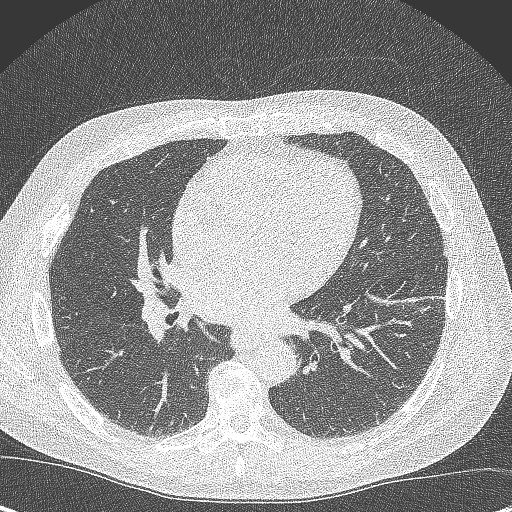
[im 168/308  lung]
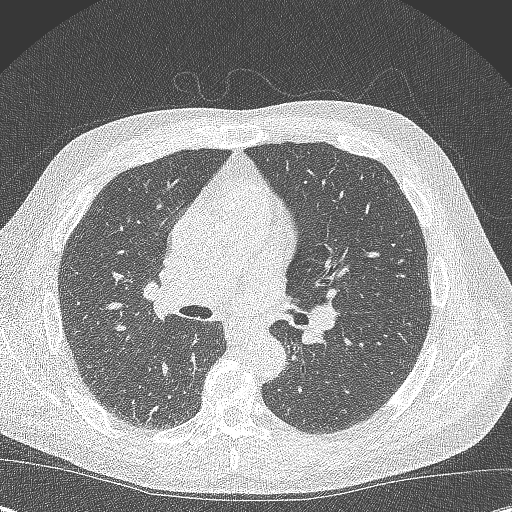
[im 196/308  lung]
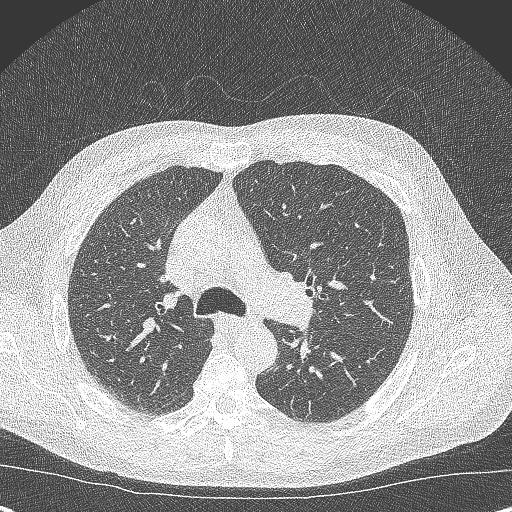
[im 210/308  mediastinal]
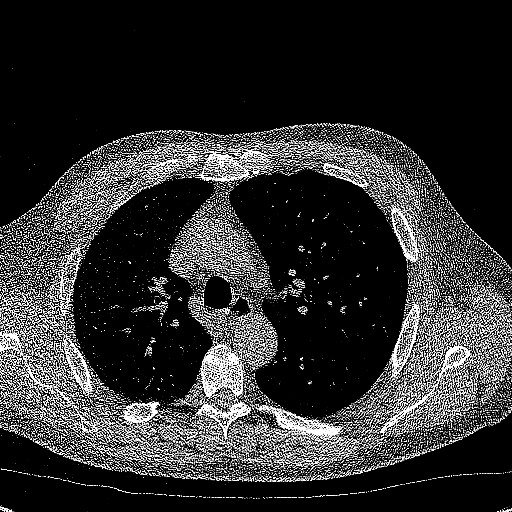
[im 210/308  lung]
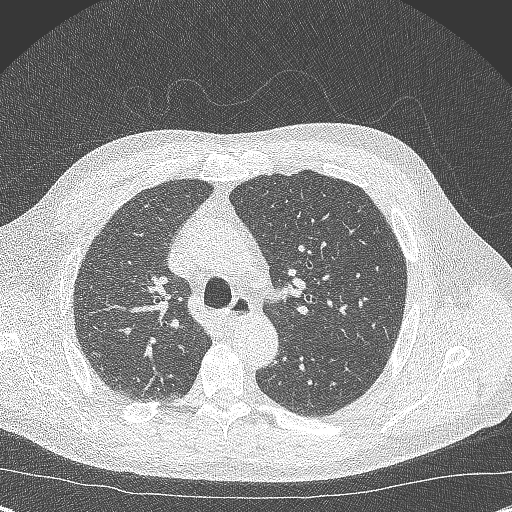
[im 238/308  lung]
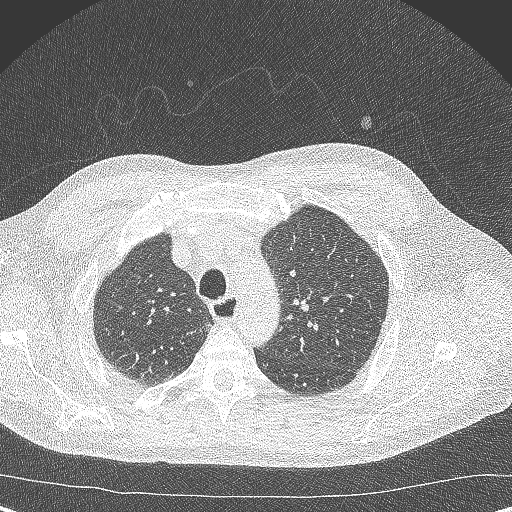
[im 266/308  lung]
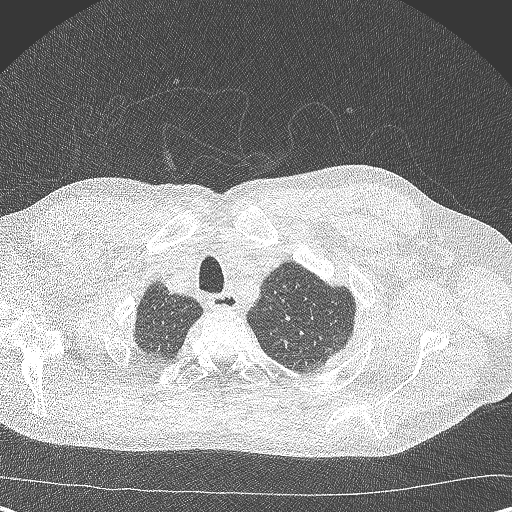
[im 294/308  lung]
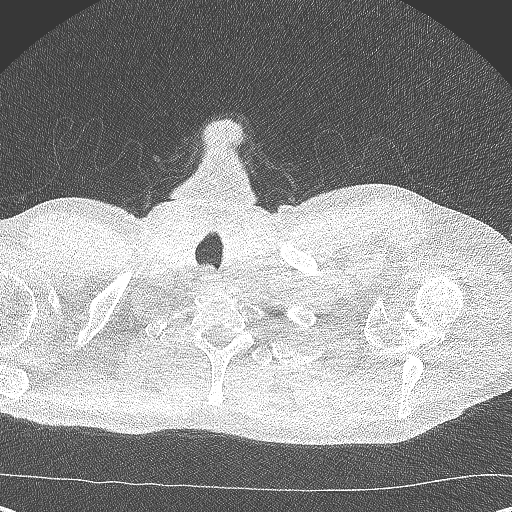

[Series 5: coronal · coronal · 0.62mm/px · 3 of 330 slices shown]
[im 66/330  lung]
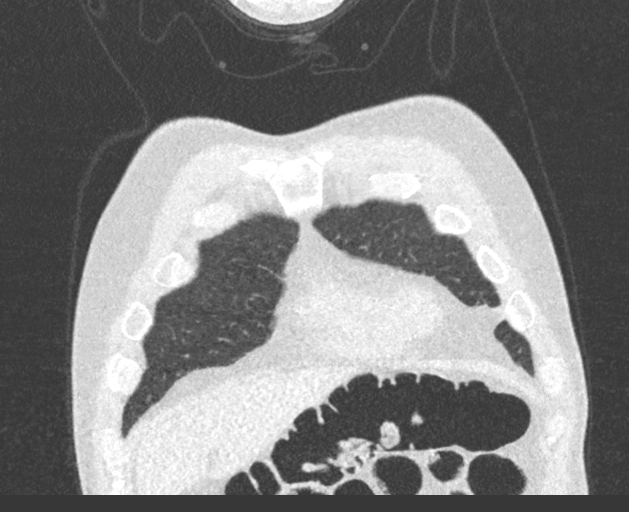
[im 132/330  lung]
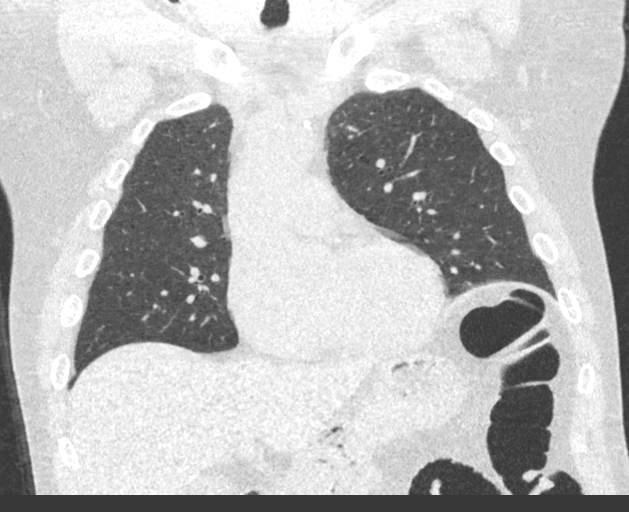
[im 198/330  lung]
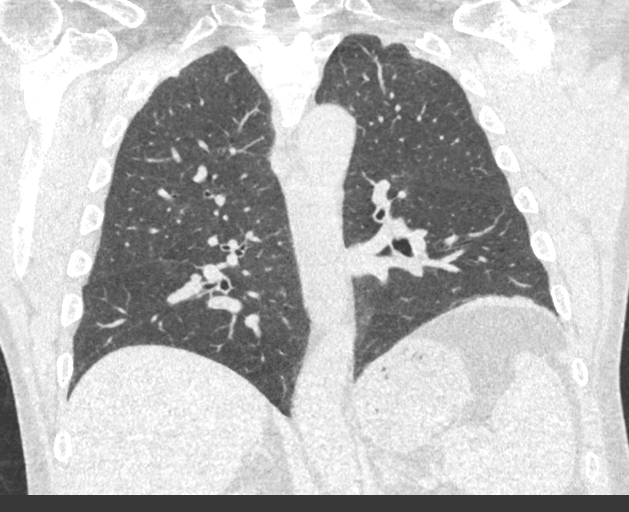

[15 of 36 positions shown; findings below may reference images not displayed]

FINDINGS: Cardiovascular: Normal heart size. No pericardial effusion. No
significant coronary artery calcifications. Atherosclerotic disease
of the thoracic aorta.

Mediastinum/Nodes: No pathologically enlarged lymph nodes seen in
the chest. Patulous esophagus.

Lungs/Pleura: Central airways are patent. Mild centrilobular
emphysema. No consolidation, pleural effusion or pneumothorax.

Upper Abdomen: No acute abnormality.

Musculoskeletal: No chest wall mass or suspicious bone lesions
identified.
IMPRESSION: 1. Lung-RADS 1, negative. Continue annual screening with low-dose
chest CT without contrast in 12 months.
2. Aortic Atherosclerosis (98LUI-WPM.M) and Emphysema (98LUI-TTJ.R).

## 2023-03-25 ENCOUNTER — Encounter (HOSPITAL_COMMUNITY): Payer: Self-pay

## 2023-03-25 ENCOUNTER — Emergency Department (HOSPITAL_COMMUNITY): Payer: Medicaid Other

## 2023-03-25 ENCOUNTER — Emergency Department (HOSPITAL_COMMUNITY)
Admission: EM | Admit: 2023-03-25 | Discharge: 2023-03-25 | Disposition: A | Discharge status: Home / Self Care | Payer: Medicaid Other | Attending: Emergency Medicine | Admitting: Emergency Medicine

## 2023-03-25 ENCOUNTER — Other Ambulatory Visit: Payer: Self-pay

## 2023-03-25 DIAGNOSIS — Z87891 Personal history of nicotine dependence: Secondary | ICD-10-CM | POA: Insufficient documentation

## 2023-03-25 DIAGNOSIS — Z79899 Other long term (current) drug therapy: Secondary | ICD-10-CM | POA: Insufficient documentation

## 2023-03-25 DIAGNOSIS — I10 Essential (primary) hypertension: Secondary | ICD-10-CM | POA: Diagnosis not present

## 2023-03-25 DIAGNOSIS — Z20822 Contact with and (suspected) exposure to covid-19: Secondary | ICD-10-CM | POA: Insufficient documentation

## 2023-03-25 DIAGNOSIS — J441 Chronic obstructive pulmonary disease with (acute) exacerbation: Secondary | ICD-10-CM | POA: Insufficient documentation

## 2023-03-25 DIAGNOSIS — R0602 Shortness of breath: Secondary | ICD-10-CM | POA: Diagnosis present

## 2023-03-25 LAB — BASIC METABOLIC PANEL
Anion gap: 8 (ref 5–15)
BUN: 16 mg/dL (ref 6–20)
CO2: 22 mmol/L (ref 22–32)
Calcium: 9.2 mg/dL (ref 8.9–10.3)
Chloride: 104 mmol/L (ref 98–111)
Creatinine, Ser: 0.98 mg/dL (ref 0.61–1.24)
GFR, Estimated: >60 mL/min (ref >60)
Glucose, Bld: 107 mg/dL — ABNORMAL HIGH (ref 70–99)
Potassium: 3.6 mmol/L (ref 3.5–5.1)
Sodium: 134 mmol/L — ABNORMAL LOW (ref 135–145)

## 2023-03-25 LAB — CBC WITH DIFFERENTIAL/PLATELET
Abs Immature Granulocytes: 0.1 10*3/uL — ABNORMAL HIGH (ref 0.00–0.07)
Basophils Absolute: 0.1 10*3/uL (ref 0.0–0.1)
Basophils Relative: 1 %
Eosinophils Absolute: 0.5 10*3/uL (ref 0.0–0.5)
Eosinophils Relative: 4 %
HCT: 48.4 % (ref 39.0–52.0)
Hemoglobin: 17 g/dL (ref 13.0–17.0)
Immature Granulocytes: 1 %
Lymphocytes Relative: 9 %
Lymphs Abs: 1.2 10*3/uL (ref 0.7–4.0)
MCH: 30.1 pg (ref 26.0–34.0)
MCHC: 35.1 g/dL (ref 30.0–36.0)
MCV: 85.8 fL (ref 80.0–100.0)
Monocytes Absolute: 1.7 10*3/uL — ABNORMAL HIGH (ref 0.1–1.0)
Monocytes Relative: 13 %
Neutro Abs: 10 10*3/uL — ABNORMAL HIGH (ref 1.7–7.7)
Neutrophils Relative %: 72 %
Platelets: 325 10*3/uL (ref 150–400)
RBC: 5.64 MIL/uL (ref 4.22–5.81)
RDW: 12.9 % (ref 11.5–15.5)
WBC: 13.6 10*3/uL — ABNORMAL HIGH (ref 4.0–10.5)
nRBC: 0 % (ref 0.0–0.2)

## 2023-03-25 LAB — RESP PANEL BY RT-PCR (RSV, FLU A&B, COVID)  RVPGX2
Influenza A by PCR: NEGATIVE
Influenza B by PCR: NEGATIVE
Resp Syncytial Virus by PCR: NEGATIVE
SARS Coronavirus 2 by RT PCR: NEGATIVE

## 2023-03-25 MED ORDER — PREDNISONE 20 MG PO TABS: 40.0000 mg | ORAL_TABLET | Freq: Every day | ORAL | 0 refills | Status: AC

## 2023-03-25 MED ORDER — AZITHROMYCIN 250 MG PO TABS: 250.0000 mg | ORAL_TABLET | Freq: Every day | ORAL | 0 refills | Status: AC

## 2023-03-25 MED ORDER — ONDANSETRON 4 MG PO TBDP: 4.0000 mg | ORAL_TABLET | Freq: Three times a day (TID) | ORAL | 0 refills | Status: AC | PRN

## 2023-03-25 MED ORDER — ALBUTEROL SULFATE HFA 108 (90 BASE) MCG/ACT IN AERS: 2.0000 | INHALATION_SPRAY | RESPIRATORY_TRACT | 2 refills | Status: AC | PRN

## 2023-03-25 NOTE — ED Provider Triage Note (Signed)
 Emergency Medicine Provider Triage Evaluation Note  Jesus Graham , a 58 y.o. male  was evaluated in triage.  Pt complains of cough and shortness of breath.  Review of Systems  Positive: See above Negative: Fever  Physical Exam  BP (!) 167/98   Pulse (!) 121   Temp 98 F (36.7 C) (Oral)   Resp 20   Ht 6' (1.829 m)   Wt 77.1 kg   SpO2 94%   BMI 23.06 kg/m  Gen:   Awake, no distress   Resp:  Normal effort  MSK:   Moves extremities without difficulty  Other:    Medical Decision Making  Medically screening exam initiated at 4:15 PM.  Appropriate orders placed.  Jesus Graham was informed that the remainder of the evaluation will be completed by another provider, this initial triage assessment does not replace that evaluation, and the importance of remaining in the ED until their evaluation is complete.   Jesus Lamar BROCKS, MD 03/26/23 [phone]

## 2023-03-25 NOTE — ED Provider Notes (Signed)
 [place] EMERGENCY DEPARTMENT AT [facility] Provider Note   CSN: [id number] Arrival date & time: 03/25/23  1031     History  Chief Complaint  Patient presents with   Shortness of Breath    Jesus Graham is a 58 y.o. male.  58 year old male with history of hypertension, tobacco use, and alcohol use who presents emergency department difficulty breathing.  Patient reports that since Friday he has been having a cough productive of tan sputum.  No fevers at all.  Says that he is feeling mildly short of breath at this point in time.  Has not smoked cigarettes in several days because of this but did smoke for years prior.  Did tell triage that he is having nausea and vomiting and diarrhea with did not disclose this to me.       Home Medications Prior to Admission medications   Medication Sig Start Date End Date Taking? Authorizing Provider  albuterol  (VENTOLIN  HFA) 108 (90 Base) MCG/ACT inhaler Inhale 2 puffs into the lungs every 4 (four) hours as needed for wheezing or shortness of breath. 03/25/23  Yes Yolande Lamar BROCKS, MD  azithromycin  (ZITHROMAX ) 250 MG tablet Take 1 tablet (250 mg total) by mouth daily. Take first 2 tablets together, then 1 every day until finished. 03/25/23  Yes Yolande Lamar BROCKS, MD  ondansetron  (ZOFRAN -ODT) 4 MG disintegrating tablet Take 1 tablet (4 mg total) by mouth every 8 (eight) hours as needed for nausea or vomiting. 03/25/23  Yes Yolande Lamar BROCKS, MD  predniSONE  (DELTASONE ) 20 MG tablet Take 2 tablets (40 mg total) by mouth daily for 5 days. 03/25/23 03/30/23 Yes Yolande Lamar BROCKS, MD  amLODipine (NORVASC) 10 MG tablet Take 10 mg by mouth daily.    [provider]  atorvastatin (LIPITOR) 40 MG tablet Take 40 mg by mouth daily. 11/22/21   [provider]  hydrochlorothiazide  (HYDRODIURIL ) 25 MG tablet Take 1 tablet (25 mg total) by mouth daily for 5 days. 09/26/20 10/01/20  Beverley Leita LABOR, PA-C  oxyCODONE -acetaminophen   (PERCOCET/ROXICET) 5-325 MG tablet Take by mouth every 4 (four) hours as needed for severe pain.    [provider]  OXYCONTIN  10 MG 12 hr tablet Take 10 mg by mouth 2 (two) times daily. 01/31/21   [provider]  pregabalin (LYRICA) 150 MG capsule Take 150 mg by mouth 2 (two) times daily. 01/13/21   [provider]  rosuvastatin (CRESTOR) 20 MG tablet Take 20 mg by mouth daily. Patient not taking: Reported on 03/15/2022 12/06/21   [provider]  zolpidem (AMBIEN) 10 MG tablet Take 10 mg by mouth at bedtime as needed. 12/20/21   [provider]      Allergies    Celexa [citalopram], Codeine, and Nsaids    Review of Systems   Review of Systems  Physical Exam Updated Vital Signs BP (!) 152/98 (BP Location: Left Arm)   Pulse 97   Temp 98.7 F (37.1 C) (Oral)   Resp 20   Ht 6' (1.829 m)   Wt 77.1 kg   SpO2 97%   BMI 23.06 kg/m  Physical Exam Vitals and nursing note reviewed.  Constitutional:      General: He is not in acute distress.    Appearance: He is well-developed.  HENT:     Head: Normocephalic and atraumatic.     Right Ear: External ear normal.     Left Ear: External ear normal.     Nose: Nose normal.  Eyes:  Extraocular Movements: Extraocular movements intact.     Conjunctiva/sclera: Conjunctivae normal.     Pupils: Pupils are equal, round, and reactive to light.  Cardiovascular:     Rate and Rhythm: Normal rate and regular rhythm.     Heart sounds: Normal heart sounds.  Pulmonary:     Effort: Pulmonary effort is normal. No respiratory distress.     Breath sounds: Wheezing and rhonchi present.     Comments: Speaking full sentences Musculoskeletal:     Cervical back: Normal range of motion and neck supple.     Right lower leg: No edema.     Left lower leg: No edema.  Skin:    General: Skin is warm and dry.  Neurological:     Mental Status: He is alert. Mental status is at baseline.  Psychiatric:        Mood and  Affect: Mood normal.        Behavior: Behavior normal.     ED Results / Procedures / Treatments   Labs (all labs ordered are listed, but only abnormal results are displayed) Labs Reviewed  CBC WITH DIFFERENTIAL/PLATELET - Abnormal; Notable for the following components:      Result Value   WBC 13.6 (*)    Neutro Abs 10.0 (*)    Monocytes Absolute 1.7 (*)    Abs Immature Granulocytes 0.10 (*)    All other components within normal limits  BASIC METABOLIC PANEL - Abnormal; Notable for the following components:   Sodium 134 (*)    Glucose, Bld 107 (*)    All other components within normal limits  RESP PANEL BY RT-PCR (RSV, FLU A&B, COVID)  [id number]    EKG EKG Interpretation Date/Time:  Monday March 25 2023 10:53:29 EST Ventricular Rate:  118 PR Interval:  162 QRS Duration:  94 QT Interval:  314 QTC Calculation: 440 R Axis:   54  Text Interpretation: Sinus tachycardia Nonspecific ST abnormality Abnormal ECG Confirmed by Yolande Charleston [phone]) on 03/25/2023 4:18:53 PM  Radiology DG Chest 2 View Result Date: 03/25/2023 CLINICAL DATA:  Shortness of breath EXAM: CHEST - 2 VIEW COMPARISON:  10/31/2020 FINDINGS: The heart size and mediastinal contours are within normal limits. Mildly coarsened interstitial markings. No focal airspace consolidation, pleural effusion, or pneumothorax. The visualized skeletal structures are unremarkable. IMPRESSION: Mildly coarsened interstitial markings, which may reflect bronchitic type lung changes. No focal airspace consolidation. Electronically Signed   By: Mabel Converse D.O.   On: 03/25/2023 11:46    Procedures Procedures    Medications Ordered in ED Medications - No data to display  ED Course/ Medical Decision Making/ A&P                                 Medical Decision Making Risk Prescription drug management.   Jesus Graham is a 58 y.o. male with comorbidities that complicate the patient evaluation including hypertension, tobacco  use, and alcohol use who presents emergency department difficulty breathing.   Initial Ddx:  COPD exacerbation, URI, pneumonia  MDM/Course:  Patient resents emergency department with cough and shortness of breath.  Does have a history of tobacco use.  On exam is not in acute distress and is having diffuse expiratory wheezing.  Did have a chest x-ray that did not show evidence of pneumonia.  COVID and flu negative.  He was initially seen in triage and is requesting to leave at this time.  Since he  satting well on room air and his work of breathing is normal feel this is reasonable.  He was given a prescription of prednisone  and azithromycin  to take.  Also given a prescription for inhaler.  Will have him follow-up with his primary doctor in several days.  Return precautions discussed prior to discharge.    This patient presents to the ED for concern of complaints listed in HPI, this involves an extensive number of treatment options, and is a complaint that carries with it a high risk of complications and morbidity. Disposition including potential need for admission considered.   Dispo: DC Home. Return precautions discussed including, but not limited to, those listed in the AVS. Allowed pt time to ask questions which were answered fully prior to dc.  Records reviewed Outpatient Clinic Notes I independently reviewed the following imaging with scope of interpretation limited to determining acute life threatening conditions related to emergency care: Chest x-ray and agree with the radiologist interpretation with the following exceptions: none I have reviewed the patients home medications and made adjustments as needed  Portions of this note were generated with Dragon dictation software. Dictation errors may occur despite best attempts at proofreading.     Final Clinical Impression(s) / ED Diagnoses Final diagnoses:  COPD exacerbation (HCC)    Rx / DC Orders ED Discharge Orders          Ordered     predniSONE  (DELTASONE ) 20 MG tablet  Daily        03/25/23 1619    azithromycin  (ZITHROMAX ) 250 MG tablet  Daily        03/25/23 1619    albuterol  (VENTOLIN  HFA) 108 (90 Base) MCG/ACT inhaler  Every 4 hours PRN        03/25/23 1619    ondansetron  (ZOFRAN -ODT) 4 MG disintegrating tablet  Every 8 hours PRN        03/25/23 1623              Yolande Lamar BROCKS, MD 03/26/23 [id number]

## 2023-03-25 NOTE — Discharge Instructions (Signed)
You were seen for your COPD exacerbation in the emergency department.   At home, please use your inhalers for any wheezing.  Please take the steroids and azithromycin we have prescribed you for your COPD exacerbation as well.    Check your MyChart online for the results of any tests that had not resulted by the time you left the emergency department.   Follow-up with your primary doctor in 2-3 days regarding your visit.    Return immediately to the emergency department if you experience any of the following: Difficulty breathing, severe chest pain, or any other concerning symptoms.    Thank you for visiting our Emergency Department. It was a pleasure taking care of you today.

## 2023-03-25 NOTE — ED Triage Notes (Signed)
 Pt arrived via POV c/o SOB X 3 days. Pt reports he has N/V/D. Pt reports he is unable to "keep anything down" .

## 2024-04-17 ENCOUNTER — Encounter (INDEPENDENT_AMBULATORY_CARE_PROVIDER_SITE_OTHER): Payer: Self-pay | Admitting: *Deleted
